# Patient Record
Sex: Male | Born: 1970 | Race: White | Hispanic: No | Marital: Married | Smoking: Former smoker
Health system: Southern US, Community
[De-identification: ages and names within clinical notes are randomized; demographics above are authoritative.]

## PROBLEM LIST (undated history)

## (undated) DIAGNOSIS — I10 Essential (primary) hypertension: Secondary | ICD-10-CM

## (undated) DIAGNOSIS — M1990 Unspecified osteoarthritis, unspecified site: Secondary | ICD-10-CM

---

## 2002-04-21 ENCOUNTER — Encounter: Payer: Self-pay | Admitting: Physical Medicine and Rehabilitation

## 2002-04-21 ENCOUNTER — Ambulatory Visit (HOSPITAL_COMMUNITY)
Admission: RE | Admit: 2002-04-21 | Discharge: 2002-04-21 | Payer: Self-pay | Admitting: Physical Medicine and Rehabilitation

## 2004-04-28 ENCOUNTER — Ambulatory Visit (HOSPITAL_COMMUNITY): Admission: RE | Admit: 2004-04-28 | Discharge: 2004-04-28 | Payer: Self-pay | Admitting: Specialist

## 2005-08-10 IMAGING — CR DG ORBITS FOR FOREIGN BODY
2 series · 2 of 2 positions shown · non-contrast
Comparison: none

CLINICAL DATA: Metal working/exposure;  clearance prior to MRI

ORBITS FOR FOREIGN BODY - 2 VIEW:

[view not recorded (1 of 2)]
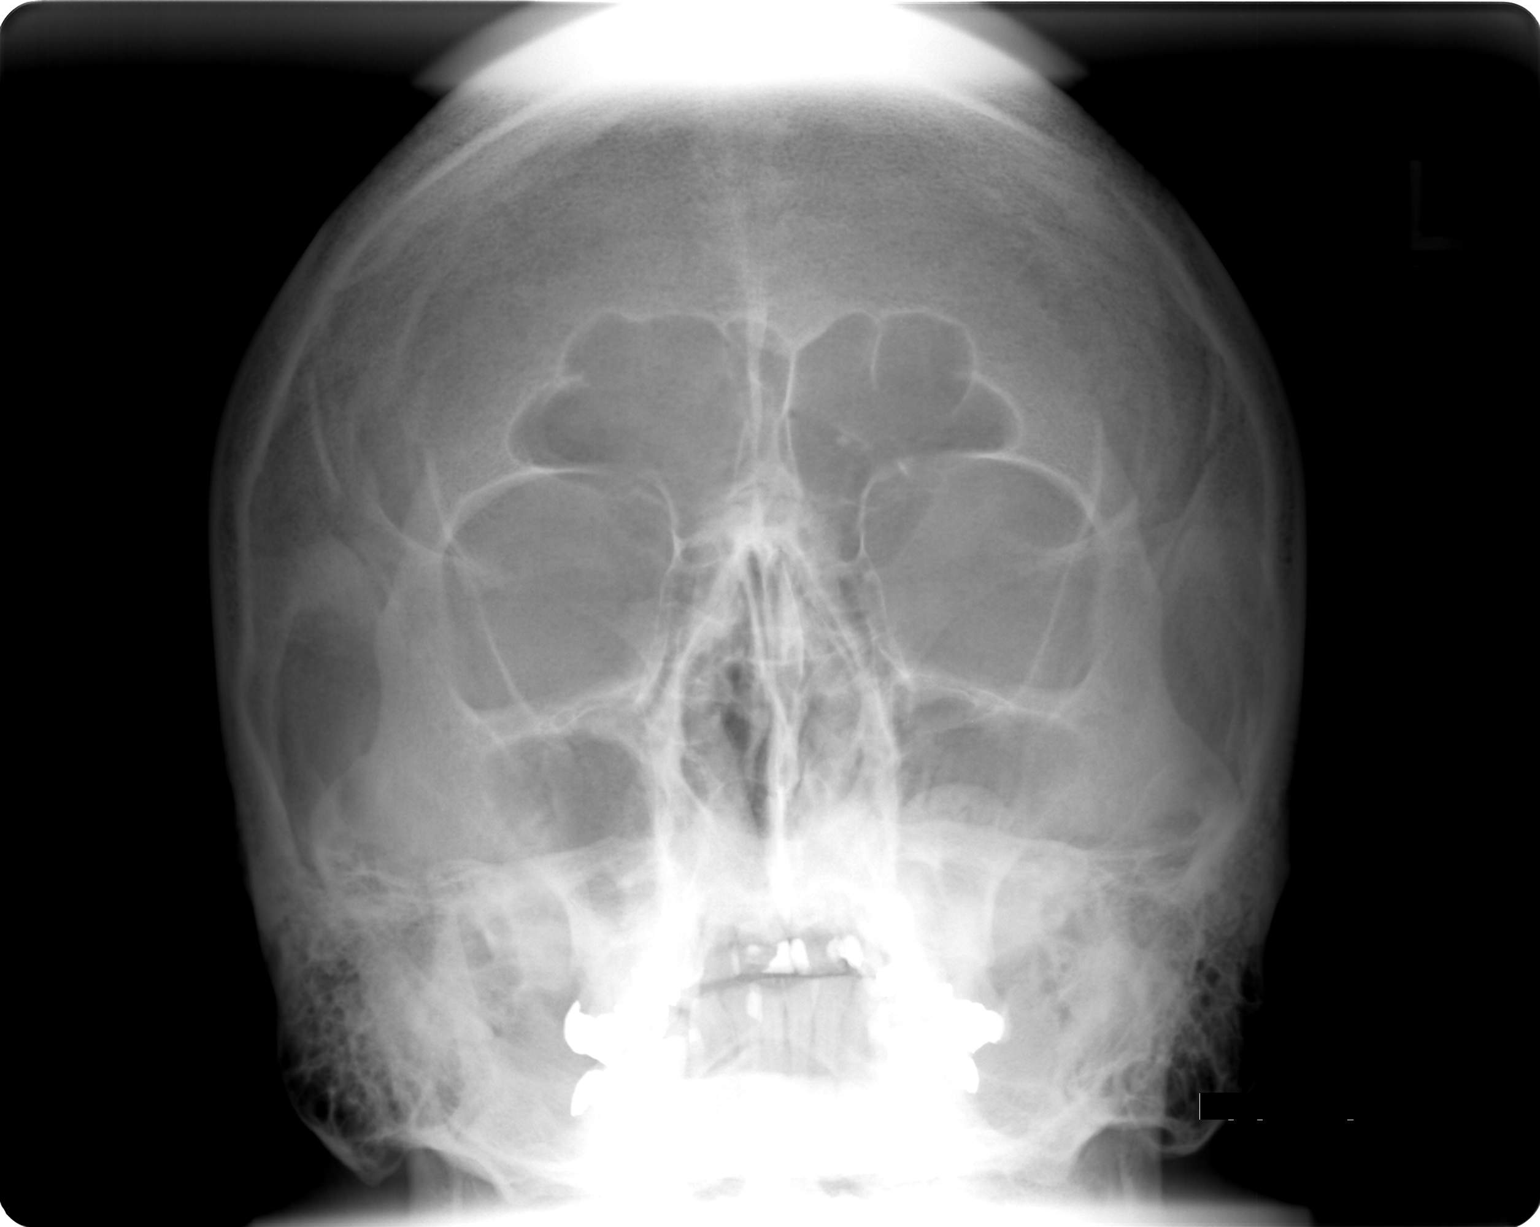

[view not recorded (2 of 2)]
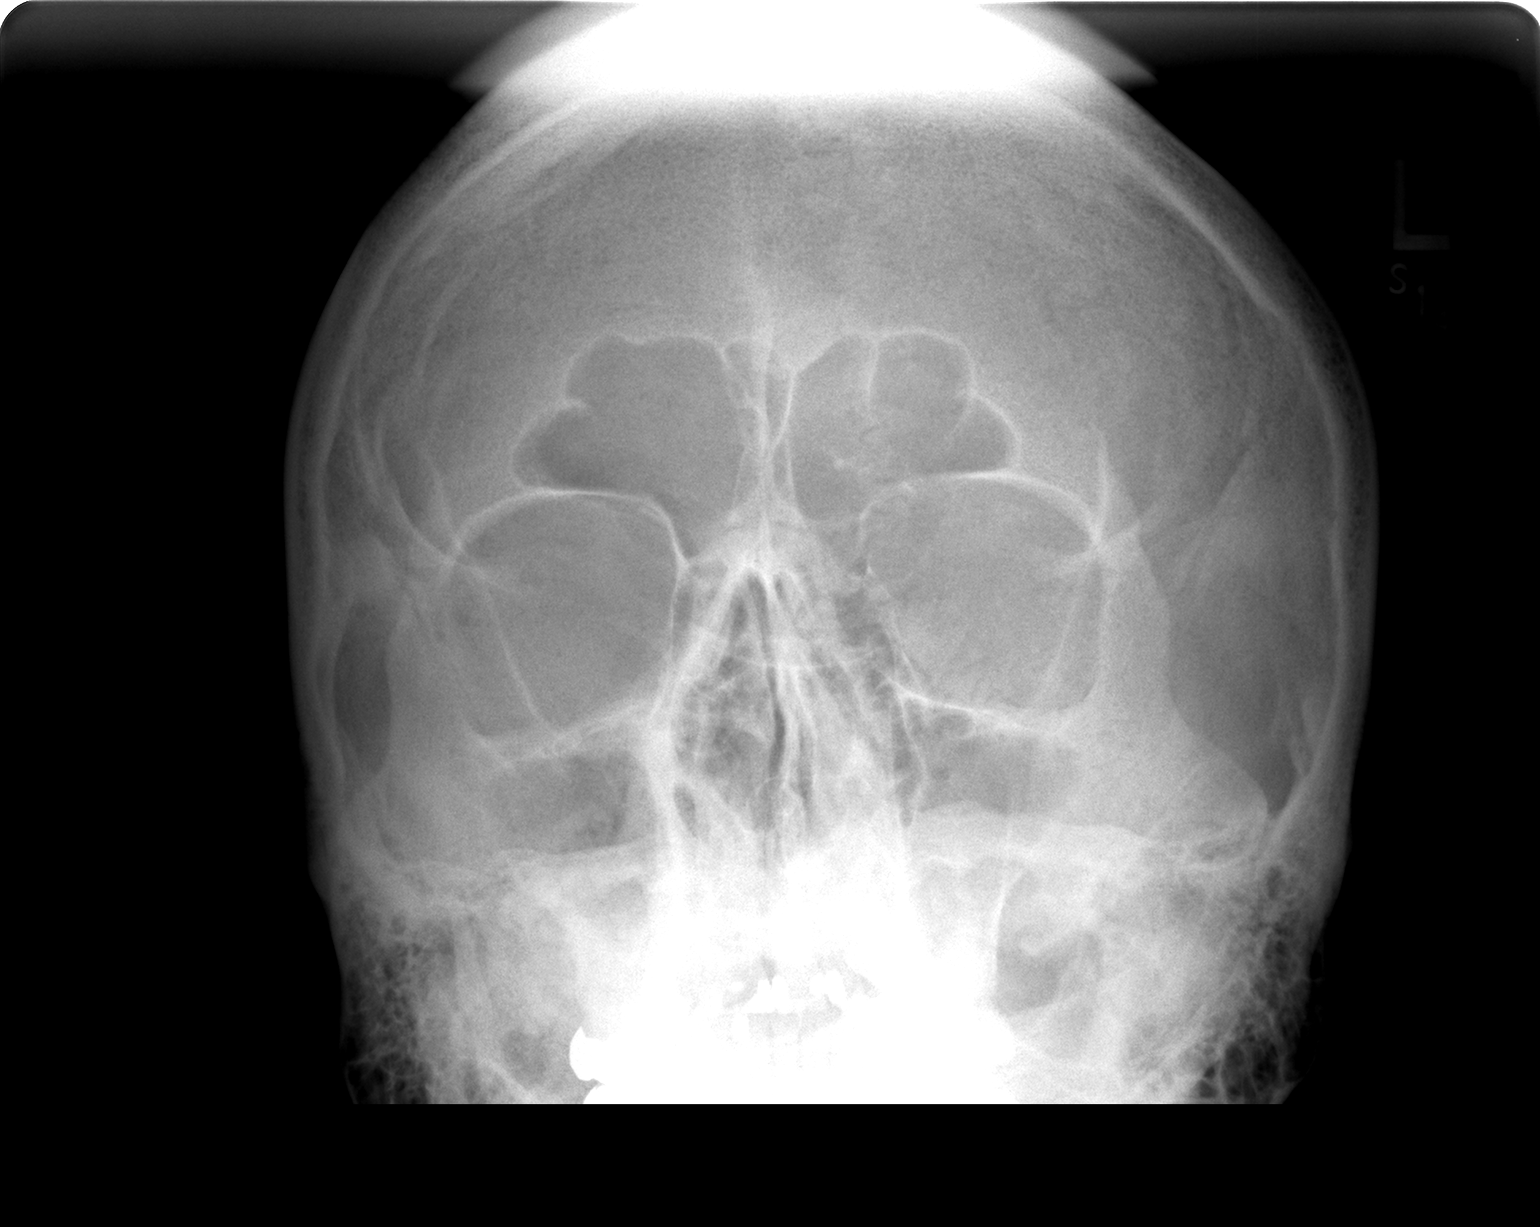

[2 of 2 positions shown; findings below may reference images not displayed]

FINDINGS: There is no evidence of metallic foreign body within the orbits.  No
significant bone abnormality identified.
IMPRESSION: No evidence of metallic foreign body within the orbits.

## 2020-05-10 NOTE — Patient Instructions (Addendum)
DUE TO COVID-19 ONLY ONE VISITOR IS ALLOWED TO COME WITH YOU AND STAY IN THE WAITING ROOM ONLY DURING PRE OP AND PROCEDURE DAY OF SURGERY. THE 1 VISITOR  MAY VISIT WITH YOU AFTER SURGERY IN YOUR PRIVATE ROOM DURING VISITING HOURS ONLY!  YOU NEED TO HAVE A COVID 19 TEST ON: 05/17/20 @ 8:30 AM, THIS TEST MUST BE DONE BEFORE SURGERY,  COVID TESTING SITE [address] [place] [address], IT IS ON THE RIGHT GOING OUT [address] APPROXIMATELY  2 MINUTES PAST [employer] ON THE RIGHT. ONCE YOUR COVID TEST IS COMPLETED,  PLEASE BEGIN THE QUARANTINE INSTRUCTIONS AS OUTLINED IN YOUR HANDOUT.                Brandon Gallegos   Your procedure is scheduled on: 05/21/20   Report to [facility] Main  Entrance   Report to short stay at: 5:30 AM     Call this number if you have problems the morning of surgery [phone]    Remember:  NO SOLID FOOD AFTER MIDNIGHT THE NIGHT PRIOR TO SURGERY. NOTHING BY MOUTH EXCEPT CLEAR LIQUIDS UNTIL: 4:15 AM . PLEASE FINISH ENSURE DRINK PER SURGEON ORDER  WHICH NEEDS TO BE COMPLETED AT: 4:15 AM .  CLEAR LIQUID DIET  Foods Allowed                                                                     Foods Excluded  Coffee and tea, regular and decaf                             liquids that you cannot  Plain Jell-O any favor except red or purple                                           see through such as: Fruit ices (not with fruit pulp)                                     milk, soups, orange juice  Iced Popsicles                                    All solid food Carbonated beverages, regular and diet                                    Cranberry, grape and apple juices Sports drinks like Gatorade Lightly seasoned clear broth or consume(fat free) Sugar, honey syrup  Sample Menu Breakfast                                Lunch  Supper Cranberry juice                    Beef broth                             Chicken broth Jell-O                                     Grape juice                           Apple juice Coffee or tea                        Jell-O                                      Popsicle                                                Coffee or tea                        Coffee or tea  _____________________________________________________________________   BRUSH YOUR TEETH MORNING OF SURGERY AND RINSE YOUR MOUTH OUT, NO CHEWING GUM CANDY OR MINTS.    Take these medicines the morning of surgery with A SIP OF WATER: otezla.                               You may not have any metal on your body including hair pins and              piercings  Do not wear jewelry, lotions, powders or perfumes, deodorant             Men may shave face and neck.   Do not bring valuables to the hospital. [facility] IS NOT             RESPONSIBLE   FOR VALUABLES.  Contacts, dentures or bridgework may not be worn into surgery.  Leave suitcase in the car. After surgery it may be brought to your room.     Patients discharged the day of surgery will not be allowed to drive home. IF YOU ARE HAVING SURGERY AND GOING HOME THE SAME DAY, YOU MUST HAVE AN ADULT TO DRIVE YOU HOME AND BE WITH YOU FOR 24 HOURS. YOU MAY GO HOME BY TAXI OR UBER OR ORTHERWISE, BUT AN ADULT MUST ACCOMPANY YOU HOME AND STAY WITH YOU FOR 24 HOURS.  Name and phone number of your driver:  Special Instructions: N/A              Please read over the following fact sheets you were given: _____________________________________________________________________         [facility] - Preparing for Surgery Before surgery, you can play an important role.  Because skin is not sterile, your skin needs to be as free of germs as possible.  You can reduce the number of germs on your skin by washing with CHG (chlorahexidine gluconate)  soap before surgery.  CHG is an antiseptic cleaner which kills germs and bonds with the skin to continue killing germs  even after washing. Please DO NOT use if you have an allergy to CHG or antibacterial soaps.  If your skin becomes reddened/irritated stop using the CHG and inform your nurse when you arrive at Short Stay. Do not shave (including legs and underarms) for at least 48 hours prior to the first CHG shower.  You may shave your face/neck. Please follow these instructions carefully:  1.  Shower with CHG Soap the night before surgery and the  morning of Surgery.  2.  If you choose to wash your hair, wash your hair first as usual with your  normal  shampoo.  3.  After you shampoo, rinse your hair and body thoroughly to remove the  shampoo.                           4.  Use CHG as you would any other liquid soap.  You can apply chg directly  to the skin and wash                       Gently with a scrungie or clean washcloth.  5.  Apply the CHG Soap to your body ONLY FROM THE NECK DOWN.   Do not use on face/ open                           Wound or open sores. Avoid contact with eyes, ears mouth and genitals (private parts).                       Wash face,  Genitals (private parts) with your normal soap.             6.  Wash thoroughly, paying special attention to the area where your surgery  will be performed.  7.  Thoroughly rinse your body with warm water from the neck down.  8.  DO NOT shower/wash with your normal soap after using and rinsing off  the CHG Soap.                9.  Pat yourself dry with a clean towel.            10.  Wear clean pajamas.            11.  Place clean sheets on your bed the night of your first shower and do not  sleep with pets. Day of Surgery : Do not apply any lotions/deodorants the morning of surgery.  Please wear clean clothes to the hospital/surgery center.  FAILURE TO FOLLOW THESE INSTRUCTIONS MAY RESULT IN THE CANCELLATION OF YOUR SURGERY PATIENT SIGNATURE_________________________________  NURSE  SIGNATURE__________________________________  ________________________________________________________________________   Brandon Gallegos  An incentive spirometer is a tool that can help keep your lungs clear and active. This tool measures how well you are filling your lungs with each breath. Taking long deep breaths may help reverse or decrease the chance of developing breathing (pulmonary) problems (especially infection) following:  A long period of time when you are unable to move or be active. BEFORE THE PROCEDURE   If the spirometer includes an indicator to show your best effort, your nurse or respiratory therapist will set it to a desired goal.  If possible, sit up straight or lean slightly forward.  Try not to slouch.  Hold the incentive spirometer in an upright position. INSTRUCTIONS FOR USE  1. Sit on the edge of your bed if possible, or sit up as far as you can in bed or on a chair. 2. Hold the incentive spirometer in an upright position. 3. Breathe out normally. 4. Place the mouthpiece in your mouth and seal your lips tightly around it. 5. Breathe in slowly and as deeply as possible, raising the piston or the ball toward the top of the column. 6. Hold your breath for 3-5 seconds or for as long as possible. Allow the piston or ball to fall to the bottom of the column. 7. Remove the mouthpiece from your mouth and breathe out normally. 8. Rest for a few seconds and repeat Steps 1 through 7 at least 10 times every 1-2 hours when you are awake. Take your time and take a few normal breaths between deep breaths. 9. The spirometer may include an indicator to show your best effort. Use the indicator as a goal to work toward during each repetition. 10. After each set of 10 deep breaths, practice coughing to be sure your lungs are clear. If you have an incision (the cut made at the time of surgery), support your incision when coughing by placing a pillow or rolled up towels firmly  against it. Once you are able to get out of bed, walk around indoors and cough well. You may stop using the incentive spirometer when instructed by your caregiver.  RISKS AND COMPLICATIONS  Take your time so you do not get dizzy or light-headed.  If you are in pain, you may need to take or ask for pain medication before doing incentive spirometry. It is harder to take a deep breath if you are having pain. AFTER USE  Rest and breathe slowly and easily.  It can be helpful to keep track of a log of your progress. Your caregiver can provide you with a simple table to help with this. If you are using the spirometer at home, follow these instructions: [place] IF:   You are having difficultly using the spirometer.  You have trouble using the spirometer as often as instructed.  Your pain medication is not giving enough relief while using the spirometer.  You develop fever of 100.5 F (38.1 C) or higher. SEEK IMMEDIATE MEDICAL CARE IF:   You cough up bloody sputum that had not been present before.  You develop fever of 102 F (38.9 C) or greater.  You develop worsening pain at or near the incision site. MAKE SURE YOU:   Understand these instructions.  Will watch your condition.  Will get help right away if you are not doing well or get worse. Document Released: 06/29/2006 Document Revised: 05/11/2011 Document Reviewed: 08/30/2006 [facility] Patient Information 2014 [place], [place].   ________________________________________________________________________

## 2020-05-13 ENCOUNTER — Encounter (HOSPITAL_COMMUNITY)
Admission: RE | Admit: 2020-05-13 | Discharge: 2020-05-13 | Disposition: A | Discharge status: Home / Self Care | Payer: BC Managed Care – PPO | Source: Ambulatory Visit | Attending: Orthopedic Surgery | Admitting: Orthopedic Surgery

## 2020-05-13 ENCOUNTER — Encounter (HOSPITAL_COMMUNITY): Payer: Self-pay

## 2020-05-13 ENCOUNTER — Other Ambulatory Visit: Payer: Self-pay

## 2020-05-13 DIAGNOSIS — Z01818 Encounter for other preprocedural examination: Secondary | ICD-10-CM | POA: Insufficient documentation

## 2020-05-13 HISTORY — DX: Unspecified osteoarthritis, unspecified site: M19.90

## 2020-05-13 HISTORY — DX: Essential (primary) hypertension: I10

## 2020-05-13 LAB — CBC
HCT: 48.7 % (ref 39.0–52.0)
Hemoglobin: 15.8 g/dL (ref 13.0–17.0)
MCH: 30 pg (ref 26.0–34.0)
MCHC: 32.4 g/dL (ref 30.0–36.0)
MCV: 92.6 fL (ref 80.0–100.0)
Platelets: 205 10*3/uL (ref 150–400)
RBC: 5.26 MIL/uL (ref 4.22–5.81)
RDW: 13.2 % (ref 11.5–15.5)
WBC: 7.2 10*3/uL (ref 4.0–10.5)
nRBC: 0 % (ref 0.0–0.2)

## 2020-05-13 LAB — BASIC METABOLIC PANEL
Anion gap: 9 (ref 5–15)
BUN: 14 mg/dL (ref 6–20)
CO2: 27 mmol/L (ref 22–32)
Calcium: 10.2 mg/dL (ref 8.9–10.3)
Chloride: 105 mmol/L (ref 98–111)
Creatinine, Ser: 1.14 mg/dL (ref 0.61–1.24)
GFR, Estimated: >60 mL/min (ref >60)
Glucose, Bld: 104 mg/dL — ABNORMAL HIGH (ref 70–99)
Potassium: 4.9 mmol/L (ref 3.5–5.1)
Sodium: 141 mmol/L (ref 135–145)

## 2020-05-13 LAB — SURGICAL PCR SCREEN
MRSA, PCR: NEGATIVE
Staphylococcus aureus: NEGATIVE

## 2020-05-13 NOTE — Progress Notes (Addendum)
COVID Vaccine Completed: Yes Date COVID Vaccine completed: 03/2019. [place] COVID vaccine manufacturer: Moderna     PCP - Dr. Earlie Server Cardiologist -   Chest x-ray -  EKG - requested from Dr. Corene Cornea office Stress Test -  ECHO -  Cardiac Cath -  Pacemaker/ICD device last checked:  Sleep Study - Yes CPAP - No  Fasting Blood Sugar -  Checks Blood Sugar _____ times a day  Blood Thinner Instructions:  Aspirin Instructions: To hold for 7 days before surgery, as per Dr. Charlann Boxer. Last Dose:  Anesthesia review: Hx: HTN,OSA(No CPAP)  Patient denies shortness of breath, fever, cough and chest pain at PAT appointment   Patient verbalized understanding of instructions that were given to them at the PAT appointment. Patient was also instructed that they will need to review over the PAT instructions again at home before surgery.

## 2020-05-17 ENCOUNTER — Other Ambulatory Visit (HOSPITAL_COMMUNITY)
Admission: RE | Admit: 2020-05-17 | Discharge: 2020-05-17 | Disposition: A | Discharge status: Home / Self Care | Payer: BC Managed Care – PPO | Source: Ambulatory Visit | Attending: Orthopedic Surgery | Admitting: Orthopedic Surgery

## 2020-05-17 DIAGNOSIS — Z20822 Contact with and (suspected) exposure to covid-19: Secondary | ICD-10-CM | POA: Diagnosis not present

## 2020-05-17 DIAGNOSIS — Z01812 Encounter for preprocedural laboratory examination: Secondary | ICD-10-CM | POA: Insufficient documentation

## 2020-05-17 LAB — SARS CORONAVIRUS 2 (TAT 6-24 HRS): SARS Coronavirus 2: NEGATIVE

## 2020-05-19 NOTE — H&P (Signed)
TOTAL KNEE ADMISSION H&P  Patient is being admitted for right total knee arthroplasty.  Subjective:  Chief Complaint:    Right knee OA / pain  HPI: Brandon Gallegos, 50 y.o. male, has a history of pain and functional disability in the right knee due to arthritis and has failed non-surgical conservative treatments for greater than 12 weeks to include NSAID's and/or analgesics, corticosteriod injections and activity modification.  Onset of symptoms was gradual, starting 10 years ago with gradually worsening course since that time. The patient noted prior procedures on the knee to include  ACL reconstruction and MCL reconstruction on the right knee(s).  Patient currently rates pain in the right knee(s) at 10 out of 10 with activity. Patient has night pain, worsening of pain with activity and weight bearing, pain that interferes with activities of daily living, pain with passive range of motion, crepitus and joint swelling.  Patient has evidence of periarticular osteophytes and joint space narrowing by imaging studies. There is no active infection.  Risks, benefits and expectations were discussed with the patient.  Risks including but not limited to the risk of anesthesia, blood clots, nerve damage, blood vessel damage, failure of the prosthesis, infection and up to and including death.  Patient understand the risks, benefits and expectations and wishes to proceed with surgery.   D/C Plans:      Home   Post-op Meds:       No Rx given   Tranexamic Acid:      To be given - IV   Decadron:      Is to be given  FYI:      ASA  Percocet 5/325 (used after previous surgery)  DME:   Rx sent for - RW & 3-N-1  PT:   OPPT    Pharmacy: [facility] - [place]    Past Medical History:  Diagnosis Date  . Arthritis   . Hypertension     Past Surgical History:  Procedure Laterality Date  . BACK SURGERY     x 2  . HERNIA REPAIR    . KNEE ARTHROSCOPY Right     No current facility-administered  medications for this encounter.   Current Outpatient Medications  Medication Sig Dispense Refill Last Dose  . Aspirin-Acetaminophen-Caffeine (GOODY HEADACHE PO) Take 1 packet by mouth daily as needed (pain).     Marland Kitchen lisinopril (ZESTRIL) 20 MG tablet Take 20 mg by mouth daily.     Marland Kitchen OTEZLA 30 MG TABS Take 30 mg by mouth 2 (two) times daily.     Marland Kitchen triamcinolone (KENALOG) 0.1 % Apply 1 application topically 2 (two) times daily as needed (psoriasis). TAC 0.1%-SSD1% 3:1      No Known Allergies   Social History   Tobacco Use  . Smoking status: Former Smoker    Packs/day: 2.00    Years: 10.00    Pack years: 20.00    Types: Cigarettes  . Smokeless tobacco: Never Used  Substance Use Topics  . Alcohol use: Not Currently       Review of Systems  Constitutional: Negative.   HENT: Negative.   Eyes: Negative.   Respiratory: Negative.   Cardiovascular: Negative.   Gastrointestinal: Negative.   Genitourinary: Negative.   Musculoskeletal: Positive for joint pain.  Skin: Negative.   Neurological: Negative.   Endo/Heme/Allergies: Negative.   Psychiatric/Behavioral: Negative.      Objective:  Physical Exam Constitutional:      Appearance: He is well-developed.  HENT:  Head: Normocephalic.  Eyes:     Pupils: Pupils are equal, round, and reactive to light.  Neck:     Thyroid: No thyromegaly.     Vascular: No JVD.     Trachea: No tracheal deviation.  Cardiovascular:     Rate and Rhythm: Normal rate and regular rhythm.  Pulmonary:     Effort: Pulmonary effort is normal. No respiratory distress.     Breath sounds: Normal breath sounds. No wheezing.  Abdominal:     Palpations: Abdomen is soft.     Tenderness: There is no abdominal tenderness. There is no guarding.  Musculoskeletal:     Cervical back: Neck supple.     Right knee: Swelling and bony tenderness present. No erythema or ecchymosis. Decreased range of motion. Tenderness present.  Lymphadenopathy:     Cervical: No  cervical adenopathy.  Skin:    General: Skin is warm and dry.  Neurological:     Mental Status: He is alert and oriented to person, place, and time.      Labs:  Estimated body mass index is 33.6 kg/m as calculated from the following:   Height as of 05/13/20: 5\' 8"  (1.727 m).   Weight as of 05/13/20: 100.2 kg.   Imaging Review Plain radiographs demonstrate severe degenerative joint disease of the right knee(s). The bone quality appears to be good for age and reported activity level.      Assessment/Plan:  End stage arthritis, right knee   The patient history, physical examination, clinical judgment of the provider and imaging studies are consistent with end stage degenerative joint disease of the right knee(s) and total knee arthroplasty is deemed medically necessary. The treatment options including medical management, injection therapy arthroscopy and arthroplasty were discussed at length. The risks and benefits of total knee arthroplasty were presented and reviewed. The risks due to aseptic loosening, infection, stiffness, patella tracking problems, thromboembolic complications and other imponderables were discussed. The patient acknowledged the explanation, agreed to proceed with the plan and consent was signed. Patient is being admitted for treatment for surgery, pain control, PT, OT, prophylactic antibiotics, VTE prophylaxis, progressive ambulation and ADL's and discharge planning. The patient is planning to be discharged home.     Patient's anticipated LOS is less than 2 midnights, meeting these requirements: - Younger than 88 - Lives within 1 hour of care - Has a competent adult at home to recover with post-op recover - NO history of  - Chronic pain requiring opiods  - Diabetes  - Coronary Artery Disease  - Heart failure  - Heart attack  - Stroke  - DVT/VTE  - Cardiac arrhythmia  - Respiratory Failure/COPD  - Renal failure  - Anemia  - Advanced Liver  disease

## 2020-05-20 ENCOUNTER — Encounter (HOSPITAL_COMMUNITY): Payer: Self-pay | Admitting: Orthopedic Surgery

## 2020-05-20 NOTE — Anesthesia Preprocedure Evaluation (Addendum)
Anesthesia Evaluation  Patient identified by MRN, date of birth, ID band Patient awake    Reviewed: Allergy & Precautions, NPO status , Patient's Chart, lab work & pertinent test results, reviewed documented beta blocker date and time   Airway Mallampati: I  TM Distance: >3 FB Neck ROM: Full    Dental  (+) Missing, Caps, Dental Advisory Given   Pulmonary sleep apnea , former smoker,    Pulmonary exam normal breath sounds clear to auscultation       Cardiovascular hypertension, Pt. on medications Normal cardiovascular exam Rhythm:Regular Rate:Normal     Neuro/Psych negative neurological ROS  negative psych ROS   GI/Hepatic negative GI ROS, Neg liver ROS,   Endo/Other  Obesity  Renal/GU negative Renal ROS  negative genitourinary   Musculoskeletal  (+) Arthritis , Osteoarthritis,  OA right knee Psoriasis Hx/o previous back surgery   Abdominal (+) + obese,   Peds  Hematology negative hematology ROS (+)   Anesthesia Other Findings   Reproductive/Obstetrics                           Anesthesia Physical Anesthesia Plan  ASA: II  Anesthesia Plan: Spinal   Post-op Pain Management:  Regional for Post-op pain   Induction: Intravenous  PONV Risk Score and Plan: 3 and Treatment may vary due to age or medical condition, Midazolam, Propofol infusion and Ondansetron  Airway Management Planned: Natural Airway and Simple Face Mask  Additional Equipment:   Intra-op Plan:   Post-operative Plan: Extubation in OR  Informed Consent: I have reviewed the patients History and Physical, chart, labs and discussed the procedure including the risks, benefits and alternatives for the proposed anesthesia with the patient or authorized representative who has indicated his/her understanding and acceptance.     Dental advisory given  Plan Discussed with: Anesthesiologist and CRNA  Anesthesia Plan  Comments:        Anesthesia Quick Evaluation

## 2020-05-21 ENCOUNTER — Observation Stay (HOSPITAL_COMMUNITY)
Admission: RE | Admit: 2020-05-21 | Discharge: 2020-05-22 | Disposition: A | Discharge status: Home / Self Care | Payer: BC Managed Care – PPO | Attending: Orthopedic Surgery | Admitting: Orthopedic Surgery

## 2020-05-21 ENCOUNTER — Ambulatory Visit (HOSPITAL_COMMUNITY): Payer: BC Managed Care – PPO | Admitting: Certified Registered"

## 2020-05-21 ENCOUNTER — Other Ambulatory Visit: Payer: Self-pay

## 2020-05-21 ENCOUNTER — Encounter (HOSPITAL_COMMUNITY): Payer: Self-pay | Admitting: Orthopedic Surgery

## 2020-05-21 ENCOUNTER — Encounter (HOSPITAL_COMMUNITY)
Admission: RE | Disposition: A | Discharge status: Home / Self Care | Payer: Self-pay | Source: Home / Self Care | Attending: Orthopedic Surgery

## 2020-05-21 DIAGNOSIS — Z87891 Personal history of nicotine dependence: Secondary | ICD-10-CM | POA: Diagnosis not present

## 2020-05-21 DIAGNOSIS — M1711 Unilateral primary osteoarthritis, right knee: Secondary | ICD-10-CM | POA: Diagnosis not present

## 2020-05-21 DIAGNOSIS — Z79899 Other long term (current) drug therapy: Secondary | ICD-10-CM | POA: Insufficient documentation

## 2020-05-21 DIAGNOSIS — Z96651 Presence of right artificial knee joint: Secondary | ICD-10-CM

## 2020-05-21 DIAGNOSIS — I10 Essential (primary) hypertension: Secondary | ICD-10-CM | POA: Diagnosis not present

## 2020-05-21 HISTORY — PX: TOTAL KNEE ARTHROPLASTY: SHX125

## 2020-05-21 LAB — TYPE AND SCREEN
ABO/RH(D): A POS
Antibody Screen: NEGATIVE

## 2020-05-21 LAB — ABO/RH: ABO/RH(D): A POS

## 2020-05-21 SURGERY — ARTHROPLASTY, KNEE, TOTAL
Anesthesia: Spinal | Site: Knee | Laterality: Right

## 2020-05-21 MED ORDER — FENTANYL CITRATE (PF) 100 MCG/2ML IJ SOLN
25.0000 ug | INTRAMUSCULAR | Status: DC | PRN
Start: 2020-05-21 — End: 2020-05-21

## 2020-05-21 MED ORDER — SODIUM CHLORIDE 0.9 % IR SOLN
Status: DC | PRN
  Administered 2020-05-21: 3000 mL

## 2020-05-21 MED ORDER — METOCLOPRAMIDE HCL 5 MG/ML IJ SOLN
5.0000 mg | Freq: Three times a day (TID) | INTRAMUSCULAR | Status: DC | PRN
Start: 2020-05-21 — End: 2020-05-22

## 2020-05-21 MED ORDER — ONDANSETRON HCL 4 MG/2ML IJ SOLN: 4.0000 mg | Freq: Four times a day (QID) | INTRAMUSCULAR | Status: DC | PRN

## 2020-05-21 MED ORDER — FENTANYL CITRATE (PF) 100 MCG/2ML IJ SOLN
INTRAMUSCULAR | Status: DC | PRN
  Administered 2020-05-21 (×2): 50 ug via INTRAVENOUS

## 2020-05-21 MED ORDER — MENTHOL 3 MG MT LOZG: 1.0000 | LOZENGE | OROMUCOSAL | Status: DC | PRN

## 2020-05-21 MED ORDER — OXYCODONE HCL 5 MG/5ML PO SOLN: 5.0000 mg | Freq: Once | ORAL | Status: DC | PRN

## 2020-05-21 MED ORDER — METOCLOPRAMIDE HCL 5 MG PO TABS: 5.0000 mg | ORAL_TABLET | Freq: Three times a day (TID) | ORAL | Status: DC | PRN

## 2020-05-21 MED ORDER — TRIAMCINOLONE ACETONIDE 0.1 % EX CREA: 1.0000 "application " | TOPICAL_CREAM | Freq: Two times a day (BID) | CUTANEOUS | Status: DC | PRN

## 2020-05-21 MED ORDER — GLYCOPYRROLATE 0.2 MG/ML IJ SOLN
INTRAMUSCULAR | Status: DC | PRN
  Administered 2020-05-21: .2 mg via INTRAVENOUS

## 2020-05-21 MED ORDER — TRANEXAMIC ACID-NACL 1000-0.7 MG/100ML-% IV SOLN
1000.0000 mg | INTRAVENOUS | Status: AC
  Administered 2020-05-21: 1000 mg via INTRAVENOUS
  Filled 2020-05-21: qty 100

## 2020-05-21 MED ORDER — ONDANSETRON HCL 4 MG/2ML IJ SOLN: 4.0000 mg | Freq: Once | INTRAMUSCULAR | Status: DC | PRN

## 2020-05-21 MED ORDER — DOCUSATE SODIUM 100 MG PO CAPS
100.0000 mg | ORAL_CAPSULE | Freq: Two times a day (BID) | ORAL | Status: DC
  Administered 2020-05-22: 100 mg via ORAL
  Filled 2020-05-21 (×2): qty 1

## 2020-05-21 MED ORDER — METHOCARBAMOL 500 MG PO TABS
500.0000 mg | ORAL_TABLET | Freq: Four times a day (QID) | ORAL | Status: DC | PRN
  Administered 2020-05-21 – 2020-05-22 (×2): 500 mg via ORAL
  Filled 2020-05-21 (×3): qty 1

## 2020-05-21 MED ORDER — LACTATED RINGERS IV SOLN: INTRAVENOUS | Status: DC

## 2020-05-21 MED ORDER — BISACODYL 10 MG RE SUPP: 10.0000 mg | Freq: Every day | RECTAL | Status: DC | PRN

## 2020-05-21 MED ORDER — MIDAZOLAM HCL 2 MG/2ML IJ SOLN
INTRAMUSCULAR | Status: AC
  Filled 2020-05-21: qty 2

## 2020-05-21 MED ORDER — KETOROLAC TROMETHAMINE 30 MG/ML IJ SOLN
INTRAMUSCULAR | Status: AC
  Filled 2020-05-21: qty 1

## 2020-05-21 MED ORDER — TRANEXAMIC ACID-NACL 1000-0.7 MG/100ML-% IV SOLN
1000.0000 mg | Freq: Once | INTRAVENOUS | Status: AC
  Administered 2020-05-21: 1000 mg via INTRAVENOUS
  Filled 2020-05-21: qty 100

## 2020-05-21 MED ORDER — HYDROMORPHONE HCL 1 MG/ML IJ SOLN
0.5000 mg | INTRAMUSCULAR | Status: DC | PRN
  Administered 2020-05-21: 1 mg via INTRAVENOUS
  Filled 2020-05-21: qty 1

## 2020-05-21 MED ORDER — PROPOFOL 10 MG/ML IV BOLUS
INTRAVENOUS | Status: DC | PRN
  Administered 2020-05-21: 30 mg via INTRAVENOUS
  Administered 2020-05-21: 20 mg via INTRAVENOUS

## 2020-05-21 MED ORDER — FERROUS SULFATE 325 (65 FE) MG PO TABS
325.0000 mg | ORAL_TABLET | Freq: Three times a day (TID) | ORAL | Status: DC
  Administered 2020-05-22 (×2): 325 mg via ORAL
  Filled 2020-05-21 (×2): qty 1

## 2020-05-21 MED ORDER — DEXAMETHASONE SODIUM PHOSPHATE 10 MG/ML IJ SOLN
10.0000 mg | Freq: Once | INTRAMUSCULAR | Status: AC
  Administered 2020-05-22: 10 mg via INTRAVENOUS
  Filled 2020-05-21: qty 1

## 2020-05-21 MED ORDER — DEXAMETHASONE SODIUM PHOSPHATE 10 MG/ML IJ SOLN
10.0000 mg | Freq: Once | INTRAMUSCULAR | Status: AC
  Administered 2020-05-21: 10 mg via INTRAVENOUS

## 2020-05-21 MED ORDER — PHENOL 1.4 % MT LIQD: 1.0000 | OROMUCOSAL | Status: DC | PRN

## 2020-05-21 MED ORDER — CEFAZOLIN SODIUM-DEXTROSE 2-4 GM/100ML-% IV SOLN
2.0000 g | Freq: Four times a day (QID) | INTRAVENOUS | Status: AC
  Administered 2020-05-21: 2 g via INTRAVENOUS
  Filled 2020-05-21 (×3): qty 100

## 2020-05-21 MED ORDER — PHENYLEPHRINE HCL-NACL 10-0.9 MG/250ML-% IV SOLN
INTRAVENOUS | Status: AC
  Filled 2020-05-21: qty 500

## 2020-05-21 MED ORDER — ORAL CARE MOUTH RINSE: 15.0000 mL | Freq: Once | OROMUCOSAL | Status: AC

## 2020-05-21 MED ORDER — FENTANYL CITRATE (PF) 100 MCG/2ML IJ SOLN
INTRAMUSCULAR | Status: AC
  Filled 2020-05-21: qty 2

## 2020-05-21 MED ORDER — PHENYLEPHRINE HCL-NACL 10-0.9 MG/250ML-% IV SOLN
INTRAVENOUS | Status: DC | PRN
  Administered 2020-05-21: 50 ug/min via INTRAVENOUS

## 2020-05-21 MED ORDER — BUPIVACAINE IN DEXTROSE 0.75-8.25 % IT SOLN
INTRATHECAL | Status: DC | PRN
  Administered 2020-05-21: 1.7 mL via INTRATHECAL

## 2020-05-21 MED ORDER — POLYETHYLENE GLYCOL 3350 17 G PO PACK: 17.0000 g | PACK | Freq: Every day | ORAL | Status: DC | PRN

## 2020-05-21 MED ORDER — ASPIRIN 81 MG PO CHEW
81.0000 mg | CHEWABLE_TABLET | Freq: Two times a day (BID) | ORAL | Status: DC
  Administered 2020-05-22: 81 mg via ORAL
  Filled 2020-05-21 (×2): qty 1

## 2020-05-21 MED ORDER — MIDAZOLAM HCL 2 MG/2ML IJ SOLN
INTRAMUSCULAR | Status: DC | PRN
  Administered 2020-05-21: 2 mg via INTRAVENOUS

## 2020-05-21 MED ORDER — OXYCODONE HCL 5 MG PO TABS: 5.0000 mg | ORAL_TABLET | Freq: Once | ORAL | Status: DC | PRN

## 2020-05-21 MED ORDER — SODIUM CHLORIDE (PF) 0.9 % IJ SOLN
INTRAMUSCULAR | Status: AC
  Filled 2020-05-21: qty 30

## 2020-05-21 MED ORDER — ONDANSETRON HCL 4 MG/2ML IJ SOLN
INTRAMUSCULAR | Status: DC | PRN
  Administered 2020-05-21: 4 mg via INTRAVENOUS

## 2020-05-21 MED ORDER — METHOCARBAMOL 1000 MG/10ML IJ SOLN
500.0000 mg | Freq: Four times a day (QID) | INTRAVENOUS | Status: DC | PRN
  Filled 2020-05-21: qty 5

## 2020-05-21 MED ORDER — ACETAMINOPHEN 325 MG PO TABS
325.0000 mg | ORAL_TABLET | Freq: Four times a day (QID) | ORAL | Status: DC | PRN
  Administered 2020-05-21: 650 mg via ORAL
  Filled 2020-05-21: qty 2

## 2020-05-21 MED ORDER — BUPIVACAINE-EPINEPHRINE (PF) 0.25% -1:200000 IJ SOLN
INTRAMUSCULAR | Status: DC | PRN
  Administered 2020-05-21: 30 mL via PERINEURAL

## 2020-05-21 MED ORDER — KETOROLAC TROMETHAMINE 30 MG/ML IJ SOLN
INTRAMUSCULAR | Status: DC | PRN
  Administered 2020-05-21: 30 mg via INTRAMUSCULAR

## 2020-05-21 MED ORDER — OXYCODONE HCL 5 MG PO TABS
10.0000 mg | ORAL_TABLET | ORAL | Status: DC | PRN
  Administered 2020-05-22 (×2): 15 mg via ORAL
  Filled 2020-05-21 (×3): qty 3

## 2020-05-21 MED ORDER — OXYCODONE HCL 5 MG PO TABS
5.0000 mg | ORAL_TABLET | ORAL | Status: DC | PRN
  Administered 2020-05-21 (×3): 5 mg via ORAL
  Filled 2020-05-21 (×3): qty 1

## 2020-05-21 MED ORDER — CHLORHEXIDINE GLUCONATE 0.12 % MT SOLN
15.0000 mL | Freq: Once | OROMUCOSAL | Status: AC
  Administered 2020-05-21: 15 mL via OROMUCOSAL

## 2020-05-21 MED ORDER — PROPOFOL 500 MG/50ML IV EMUL
INTRAVENOUS | Status: DC | PRN
  Administered 2020-05-21: 40 ug/kg/min via INTRAVENOUS

## 2020-05-21 MED ORDER — POVIDONE-IODINE 10 % EX SWAB
2.0000 | Freq: Once | CUTANEOUS | Status: AC
Start: 2020-05-21 — End: 2020-05-21
  Administered 2020-05-21: 2 via TOPICAL

## 2020-05-21 MED ORDER — CELECOXIB 200 MG PO CAPS
200.0000 mg | ORAL_CAPSULE | Freq: Two times a day (BID) | ORAL | Status: DC
  Administered 2020-05-22: 200 mg via ORAL
  Filled 2020-05-21 (×2): qty 1

## 2020-05-21 MED ORDER — CEFAZOLIN SODIUM-DEXTROSE 2-4 GM/100ML-% IV SOLN
2.0000 g | INTRAVENOUS | Status: AC
  Administered 2020-05-21: 2 g via INTRAVENOUS
  Filled 2020-05-21: qty 100

## 2020-05-21 MED ORDER — ONDANSETRON HCL 4 MG PO TABS: 4.0000 mg | ORAL_TABLET | Freq: Four times a day (QID) | ORAL | Status: DC | PRN

## 2020-05-21 MED ORDER — LISINOPRIL 20 MG PO TABS
20.0000 mg | ORAL_TABLET | Freq: Every day | ORAL | Status: DC
  Administered 2020-05-22: 20 mg via ORAL
  Filled 2020-05-21: qty 1

## 2020-05-21 MED ORDER — ROPIVACAINE HCL 7.5 MG/ML IJ SOLN
INTRAMUSCULAR | Status: DC | PRN
  Administered 2020-05-21: 20 mL via PERINEURAL

## 2020-05-21 MED ORDER — BUPIVACAINE-EPINEPHRINE (PF) 0.25% -1:200000 IJ SOLN
INTRAMUSCULAR | Status: AC
  Filled 2020-05-21: qty 30

## 2020-05-21 SURGICAL SUPPLY — 49 items
ATTUNE MED ANAT PAT 38 KNEE (Knees) ×2 IMPLANT
ATTUNE PS FEM RT SZ 6 CEM KNEE (Femur) ×2 IMPLANT
ATTUNE PSRP INSR SZ6 8 KNEE (Insert) ×2 IMPLANT
BASE TIBIA ATTUNE KNEE SYS SZ6 (Knees) ×1 IMPLANT
BLADE SAW SGTL 13.0X1.19X90.0M (BLADE) ×2 IMPLANT
BLADE SURG SZ10 CARB STEEL (BLADE) ×4 IMPLANT
BNDG ELASTIC 6X10 VLCR STRL LF (GAUZE/BANDAGES/DRESSINGS) ×2 IMPLANT
BNDG ELASTIC 6X5.8 VLCR STR LF (GAUZE/BANDAGES/DRESSINGS) ×2 IMPLANT
BOWL SMART MIX CTS (DISPOSABLE) ×2 IMPLANT
CEMENT HV SMART SET (Cement) ×4 IMPLANT
CUFF TOURN SGL QUICK 34 (TOURNIQUET CUFF) ×2
CUFF TRNQT CYL 34X4.125X (TOURNIQUET CUFF) ×1 IMPLANT
DERMABOND ADVANCED (GAUZE/BANDAGES/DRESSINGS) ×1
DERMABOND ADVANCED .7 DNX12 (GAUZE/BANDAGES/DRESSINGS) ×1 IMPLANT
DRAPE U-SHAPE 47X51 STRL (DRAPES) ×2 IMPLANT
DRESSING AQUACEL AG SP 3.5X10 (GAUZE/BANDAGES/DRESSINGS) ×1 IMPLANT
DRSG AQUACEL AG SP 3.5X10 (GAUZE/BANDAGES/DRESSINGS) ×2
DURAPREP 26ML APPLICATOR (WOUND CARE) ×4 IMPLANT
ELECT REM PT RETURN 15FT ADLT (MISCELLANEOUS) ×2 IMPLANT
GLOVE ORTHO TXT STRL SZ7.5 (GLOVE) ×2 IMPLANT
GLOVE SURG ENC MOIS LTX SZ6 (GLOVE) ×2 IMPLANT
GLOVE SURG LTX SZ8 (GLOVE) ×2 IMPLANT
GLOVE SURG UNDER POLY LF SZ6.5 (GLOVE) IMPLANT
GLOVE SURG UNDER POLY LF SZ7.5 (GLOVE) ×2 IMPLANT
GOWN STRL REUS W/TWL LRG LVL3 (GOWN DISPOSABLE) ×2 IMPLANT
HANDPIECE INTERPULSE COAX TIP (DISPOSABLE) ×2
KIT TURNOVER KIT A (KITS) ×2 IMPLANT
MANIFOLD NEPTUNE II (INSTRUMENTS) ×2 IMPLANT
NDL SAFETY ECLIPSE 18X1.5 (NEEDLE) IMPLANT
NEEDLE HYPO 18GX1.5 SHARP (NEEDLE)
NS IRRIG 1000ML POUR BTL (IV SOLUTION) ×2 IMPLANT
PACK TOTAL KNEE CUSTOM (KITS) ×2 IMPLANT
PENCIL SMOKE EVACUATOR (MISCELLANEOUS) ×2 IMPLANT
PIN DRILL FIX HALF THREAD (BIT) ×2 IMPLANT
PIN FIX SIGMA LCS THRD HI (PIN) ×2 IMPLANT
PROTECTOR NERVE ULNAR (MISCELLANEOUS) ×2 IMPLANT
SET HNDPC FAN SPRY TIP SCT (DISPOSABLE) ×1 IMPLANT
SET PAD KNEE POSITIONER (MISCELLANEOUS) ×2 IMPLANT
SUT MNCRL AB 4-0 PS2 18 (SUTURE) ×2 IMPLANT
SUT STRATAFIX PDS+ 0 24IN (SUTURE) ×2 IMPLANT
SUT VIC AB 1 CT1 36 (SUTURE) ×2 IMPLANT
SUT VIC AB 2-0 CT1 27 (SUTURE) ×6
SUT VIC AB 2-0 CT1 TAPERPNT 27 (SUTURE) ×3 IMPLANT
SYR 3ML LL SCALE MARK (SYRINGE) ×2 IMPLANT
TIBIA ATTUNE KNEE SYS BASE SZ6 (Knees) ×2 IMPLANT
TRAY FOLEY MTR SLVR 16FR STAT (SET/KITS/TRAYS/PACK) ×2 IMPLANT
TUBE SUCTION HIGH CAP CLEAR NV (SUCTIONS) ×2 IMPLANT
WATER STERILE IRR 1000ML POUR (IV SOLUTION) ×4 IMPLANT
WRAP KNEE MAXI GEL POST OP (GAUZE/BANDAGES/DRESSINGS) ×2 IMPLANT

## 2020-05-21 NOTE — Op Note (Signed)
NAME:  Brandon Gallegos                      MEDICAL RECORD NO.:  [id number]                             FACILITY:  [facility]      PHYSICIAN:  Madlyn Frankel. Charlann Boxer, M.D.  DATE OF BIRTH:  01-08-1971      DATE OF PROCEDURE:  05/21/2020                                     OPERATIVE REPORT         PREOPERATIVE DIAGNOSIS:  Right knee osteoarthritis.      POSTOPERATIVE DIAGNOSIS:  Right knee osteoarthritis.      FINDINGS:  The patient was noted to have complete loss of cartilage and   bone-on-bone arthritis with associated osteophytes in the medial and patellofemoral compartments of   the knee with a significant synovitis and associated effusion.  The patient had failed months of conservative treatment including medications, injection therapy, activity modification.     PROCEDURE:  Right total knee replacement with removal of proximal tibial hardware (staple)     COMPONENTS USED:  DePuy Attune rotating platform posterior stabilized knee   system, a size 6 femur, 6 tibia, size 8 mm PS AOX insert, and 38 anatomic patellar   button.      SURGEON:  Madlyn Frankel. Charlann Boxer, M.D.      ASSISTANT:  Dennie Bible, PA-C.      ANESTHESIA:  Regional and Spinal.      SPECIMENS:  None.      COMPLICATION:  None.      DRAINS:  None.  EBL: <100 cc      TOURNIQUET TIME:   Total Tourniquet Time Documented: Thigh (Right) - 33 minutes Total: Thigh (Right) - 33 minutes       The patient was stable to the recovery room.      INDICATION FOR PROCEDURE:  Brandon Gallegos is a 50 y.o. male patient of   mine.  The patient had been seen, evaluated, and treated for months conservatively in the   office with medication, activity modification, and injections.  The patient had   radiographic changes of bone-on-bone arthritis with endplate sclerosis and osteophytes noted.  Based on the radiographic changes and failed conservative measures, the patient   decided to proceed with definitive treatment, total knee  replacement.  Risks of infection, DVT, component failure, need for revision surgery, neurovascular injury were reviewed in the office setting.  The postop course was reviewed stressing the efforts to maximize post-operative satisfaction and function.  Consent was obtained for benefit of pain   relief.      PROCEDURE IN DETAIL:  The patient was brought to the operative theater.   Once adequate anesthesia, preoperative antibiotics, 2 gm of Ancef,1 gm of Tranexamic Acid, and 10 mg of Decadron administered, the patient was positioned supine with a right thigh tourniquet placed.  The  right lower extremity was prepped and draped in sterile fashion.  A time-   out was performed identifying the patient, planned procedure, and the appropriate extremity.      The right lower extremity was placed in the [facility] leg holder.  The leg was   exsanguinated, tourniquet elevated to 250 mmHg.  A  midline incision was   made followed by median parapatellar arthrotomy.  Following initial   exposure, attention was first directed to the patella.  Precut   measurement was noted to be 26 mm.  I resected down to 14 mm and used a   38 anatomic patellar button to restore patellar height as well as cover the cut surface.      The lug holes were drilled and a metal shim was placed to protect the   patella from retractors and saw blade during the procedure.      At this point, attention was now directed to the femur.  The femoral   canal was opened with a drill, irrigated to try to prevent fat emboli.  An   intramedullary rod was passed at 5 degrees valgus, 9 mm of bone was   resected off the distal femur.  Following this resection, the tibia was   subluxated anteriorly.  Using the extramedullary guide, 2 mm of bone was resected off   the proximal medial tibia.  We confirmed the gap would be   stable medially and laterally with a size 6 spacer block as well as confirmed that the tibial cut was perpendicular in the coronal  plane, checking with an alignment rod.      Once this was done, I sized the femur to be a size 6 in the anterior-   posterior dimension, chose a standard component based on medial and   lateral dimension.  The size 6 rotation block was then pinned in   position anterior referenced using the C-clamp to set rotation.  The   anterior, posterior, and  chamfer cuts were made without difficulty nor   notching making certain that I was along the anterior cortex to help   with flexion gap stability.      The final box cut was made off the lateral aspect of distal femur.   Prior to tibial preparation I did remove the previous placed surgical staple just medial to his tibial tubercle from prior ACL reconstruction.     The tibia was sized to be a size 6.  The size 6 tray was   then pinned in position through the medial third of the tubercle,   drilled, and keel punched.  Trial reduction was now carried with a 6 femur,  6 tibia, a size 8 mm PS insert, and the 38 anatomic patella botton.  The knee was brought to full extension with good flexion stability with the patella   tracking through the trochlea without application of pressure.  Given   all these findings the trial components removed.  Final components were   opened and cement was mixed.  The knee was irrigated with normal saline solution and pulse lavage.  The synovial lining was   then injected with 30 cc of 0.25% Marcaine with epinephrine, 1 cc of Toradol and 30 cc of NS for a total of 61 cc.     Final implants were then cemented onto cleaned and dried cut surfaces of bone with the knee brought to extension with a size 8 mm PS trial insert.      Once the cement had fully cured, excess cement was removed   throughout the knee.  I confirmed that I was satisfied with the range of   motion and stability, and the final size 8 mm PS AOX insert was chosen.  It was   placed into the knee.      The tourniquet  had been let down at 33 minutes.  No  significant   hemostasis was required.  The extensor mechanism was then reapproximated using #1 Vicryl and #1 Stratafix sutures with the knee   in flexion.  The   remaining wound was closed with 2-0 Vicryl and running 4-0 Monocryl.   The knee was cleaned, dried, dressed sterilely using Dermabond and   Aquacel dressing.  The patient was then   brought to recovery room in stable condition, tolerating the procedure   well.   Please note that Physician Assistant, Dennie Bible, PA-C was present for the entirety of the case, and was utilized for pre-operative positioning, peri-operative retractor management, general facilitation of the procedure and for primary wound closure at the end of the case.              Madlyn Frankel Charlann Boxer, M.D.    05/21/2020 8:41 AM

## 2020-05-21 NOTE — Transfer of Care (Signed)
Immediate Anesthesia Transfer of Care Note  Patient: Brandon Gallegos  Procedure(s) Performed: TOTAL KNEE ARTHROPLASTY (Right Knee)  Patient Location: PACU  Anesthesia Type:Spinal and MAC combined with regional for post-op pain  Level of Consciousness: awake, alert , oriented and patient cooperative  Airway & Oxygen Therapy: Patient Spontanous Breathing and Patient connected to face mask oxygen  Post-op Assessment: Report given to RN and Post -op Vital signs reviewed and stable  Post vital signs: Reviewed and stable  Last Vitals:  Vitals Value Taken Time  BP 99/57 05/21/20 0910  Temp    Pulse 71 05/21/20 0912  Resp 20 05/21/20 0912  SpO2 100 % 05/21/20 0912  Vitals shown include unvalidated device data.  Last Pain:  Vitals:   05/21/20 0552  PainSc: 2       Patients Stated Pain Goal: 4 (82/57/49 3552)  Complications: No complications documented.

## 2020-05-21 NOTE — Evaluation (Signed)
Physical Therapy Evaluation Patient Details Name: Brandon Gallegos MRN: [id number] DOB: 01/02/1971 Today's Date: 05/21/2020   History of Present Illness  Patint is a 50 y.o. male s/p Rt TKA with removal of proximal tibial hardware (staple) with PMH significant for HTN, OA, and back surgery x2.    Clinical Impression  KERI TAVELLA is a 50 y.o. male POD 0 s/p Rt TKA. Patient reports independence with mobility at baseline. Patient is now limited by functional impairments (see PT problem list below) and requires min assist/guard for transfers and gait with RW. Patient was able to ambulate ~100 feet with RW and min guard/assist. Patient instructed in exercise to facilitate ROM and circulation to manage edema and reduce risk of DVT. Patient will benefit from continued skilled PT interventions to address impairments and progress towards PLOF. Acute PT will follow to progress mobility and stair training in preparation for safe discharge home.     Follow Up Recommendations Outpatient PT;Follow surgeon's recommendation for DC plan and follow-up therapies    Equipment Recommendations  Rolling walker with 5" wheels    Recommendations for Other Services       Precautions / Restrictions Precautions Precautions: Fall Restrictions Weight Bearing Restrictions: No Other Position/Activity Restrictions: WBAT      Mobility  Bed Mobility Overal bed mobility: Needs Assistance Bed Mobility: Supine to Sit     Supine to sit: Min assist     General bed mobility comments: cues for use of bed rail, no assist required to bring Rt LE off EOB.    Transfers Overall transfer level: Needs assistance Equipment used: Rolling walker (2 wheeled) Transfers: Sit to/from Stand Sit to Stand: Min guard         General transfer comment: Min guard for safety with cues for safe hand placement for power up. pt steady once standing.  Ambulation/Gait Ambulation/Gait assistance: Min assist;Min guard Gait  Distance (Feet): 100 Feet Assistive device: Rolling walker (2 wheeled) Gait Pattern/deviations: Step-to pattern;Decreased stride length;Decreased weight shift to right;Decreased stance time - right Gait velocity: decr   General Gait Details: cues for safe step pattern and proximity to RW, no buckling at Rt knee and no overt LOB noted. min assist for walker position progressing to guarding for safety.  Stairs            Wheelchair Mobility    Modified Rankin (Stroke Patients Only)       Balance Overall balance assessment: Needs assistance Sitting-balance support: Feet supported Sitting balance-Leahy Scale: Good     Standing balance support: During functional activity;Bilateral upper extremity supported Standing balance-Leahy Scale: Poor                               Pertinent Vitals/Pain Pain Assessment: 0-10 Pain Score: 5  Pain Location: Rt knee Pain Descriptors / Indicators: Aching;Dull;Sore Pain Intervention(s): Limited activity within patient's tolerance;Monitored during session;Ice applied    Home Living Family/patient expects to be discharged to:: Private residence Living Arrangements: Spouse/significant other Available Help at Discharge: Family Type of Home: Mobile home (double wide) Home Access: Stairs to enter Entrance Stairs-Rails: None Entrance Stairs-Number of Steps: 2 Home Layout: One level Home Equipment: Crutches;Shower seat;Bedside commode;Walker - standard Additional Comments: pt's father and wife can help as he recovers    Prior Function Level of Independence: Independent               Hand Dominance   Dominant Hand: Right  Extremity/Trunk Assessment   Upper Extremity Assessment Upper Extremity Assessment: Overall WFL for tasks assessed    Lower Extremity Assessment Lower Extremity Assessment: RLE deficits/detail RLE Deficits / Details: Patient with good quad activation, no extensor lag with SLR. strong  dorsi/plantar flexion 4/5 or better bil LE. RLE Sensation: WNL RLE Coordination: WNL    Cervical / Trunk Assessment Cervical / Trunk Assessment: Normal  Communication   Communication: No difficulties  Cognition Arousal/Alertness: Awake/alert Behavior During Therapy: WFL for tasks assessed/performed Overall Cognitive Status: Within Functional Limits for tasks assessed                                        General Comments      Exercises Total Joint Exercises Ankle Circles/Pumps: AROM;Both;20 reps;Seated Quad Sets: AROM;Right;5 reps;Seated Heel Slides: AROM;Right;5 reps;Seated   Assessment/Plan    PT Assessment Patient needs continued PT services  PT Problem List Decreased strength;Decreased range of motion;Decreased activity tolerance;Decreased balance;Decreased mobility;Decreased knowledge of use of DME;Decreased knowledge of precautions;Pain       PT Treatment Interventions DME instruction;Gait training;Stair training;Functional mobility training;Therapeutic activities;Therapeutic exercise;Balance training;Neuromuscular re-education;Patient/family education    PT Goals (Current goals can be found in the Care Plan section)  Acute Rehab PT Goals Patient Stated Goal: back to gardening and dirt biking PT Goal Formulation: With patient Time For Goal Achievement: 05/28/20 Potential to Achieve Goals: Good    Frequency 7X/week   Barriers to discharge        Co-evaluation               AM-PAC PT "6 Clicks" Mobility  Outcome Measure Help needed turning from your back to your side while in a flat bed without using bedrails?: None Help needed moving from lying on your back to sitting on the side of a flat bed without using bedrails?: A Little Help needed moving to and from a bed to a chair (including a wheelchair)?: A Little Help needed standing up from a chair using your arms (e.g., wheelchair or bedside chair)?: A Little Help needed to walk in  hospital room?: A Little Help needed climbing 3-5 steps with a railing? : A Little 6 Click Score: 19    End of Session Equipment Utilized During Treatment: Gait belt Activity Tolerance: Patient tolerated treatment well Patient left: in chair;with call bell/phone within reach;with chair alarm set;with family/visitor present Nurse Communication: Mobility status;Patient requests pain meds PT Visit Diagnosis: Muscle weakness (generalized) (M62.81);Pain;Difficulty in walking, not elsewhere classified (R26.2) Pain - Right/Left: Right Pain - part of body: Knee    Time: 1245-1311 PT Time Calculation (min) (ACUTE ONLY): 26 min   Charges:   PT Evaluation $PT Eval Low Complexity: 1 Low PT Treatments $Gait Training: 8-22 mins        Wynn Maudlin, DPT Acute Rehabilitation Services Office [phone] Pager [phone]    Anitra Lauth 05/21/2020, 1:41 PM

## 2020-05-21 NOTE — Anesthesia Procedure Notes (Signed)
Spinal  Patient location during procedure: OR Start time: 05/21/2020 7:21 AM End time: 05/21/2020 7:25 AM Reason for block: surgical anesthesia Staffing Performed: anesthesiologist  Anesthesiologist: Mal Amabile, MD Preanesthetic Checklist Completed: patient identified, IV checked, site marked, risks and benefits discussed, surgical consent, monitors and equipment checked, pre-op evaluation and timeout performed Spinal Block Patient position: sitting Prep: DuraPrep and site prepped and draped Patient monitoring: heart rate, cardiac monitor, continuous pulse ox and blood pressure Approach: midline Location: L3-4 Injection technique: single-shot Needle Needle type: Pencan  Needle gauge: 24 G Needle length: 9 cm Needle insertion depth: 7 cm Assessment Sensory level: T6 Events: CSF return Additional Notes Patient tolerated procedure well. Adequate sensory level.

## 2020-05-21 NOTE — Progress Notes (Incomplete)
At shift change, I informed the patient's spouse that visiting hours were over at 8 p.m. and the patient stated they lived an hour away. The patient and his wife wanted the wife to stay in the room overnight. I informed the patient and the wife that they could discuss it with the [facility]. They were agreeable. I informed the [facility] that the patient stated he lived an hour away and wanted his wife to stay. The [facility] allowed the wife to stay since they lived in [place] and the patient to the [facility] his wife does not know her way around. When I entered the room at approximately 21:50 to administered medications and the antibiotic to the patient, the patient stated "Ypu made a liar out of me". I asked the patient what he was referring to and told him I had no idea what he was talking about. The patient stated "You told the manager I lived a long way away". I stated I told the [facility] I lived an hour away. The patient then refused all medications, stating "

## 2020-05-21 NOTE — Addendum Note (Signed)
Addendum  created 05/21/20 0956 by Mal Amabile, MD   Intraprocedure Meds edited

## 2020-05-21 NOTE — Anesthesia Postprocedure Evaluation (Signed)
Anesthesia Post Note  Patient: Brandon Gallegos  Procedure(s) Performed: TOTAL KNEE ARTHROPLASTY (Right Knee)     Patient location during evaluation: PACU Anesthesia Type: Spinal Level of consciousness: oriented and awake and alert Pain management: pain level controlled Vital Signs Assessment: post-procedure vital signs reviewed and stable Respiratory status: spontaneous breathing, respiratory function stable and nonlabored ventilation Cardiovascular status: blood pressure returned to baseline and stable Postop Assessment: no headache, no backache, no apparent nausea or vomiting, spinal receding and patient able to bend at knees Anesthetic complications: no   No complications documented.  Last Vitals:  Vitals:   05/21/20 0930 05/21/20 0945  BP: 96/63 90/69  Pulse: 71 66  Resp: 19 (!) 9  Temp:    SpO2: 92% 93%    Last Pain:  Vitals:   05/21/20 0945  PainSc: 0-No pain                 Huong Luthi A.

## 2020-05-21 NOTE — Interval H&P Note (Signed)
History and Physical Interval Note:  05/21/2020 7:05 AM  Brandon Gallegos  has presented today for surgery, with the diagnosis of Right knee osteoarthritis.  The various methods of treatment have been discussed with the patient and family. After consideration of risks, benefits and other options for treatment, the patient has consented to  Procedure(s) with comments: TOTAL KNEE ARTHROPLASTY (Right) - 70 mins as a surgical intervention.  The patient's history has been reviewed, patient examined, no change in status, stable for surgery.  I have reviewed the patient's chart and labs.  Questions were answered to the patient's satisfaction.     [name]

## 2020-05-21 NOTE — Discharge Instructions (Signed)
INSTRUCTIONS AFTER JOINT REPLACEMENT    Please Hold your [place] for 2 weeks following surgery. Then resume at home dose.   o Remove items at home which could result in a fall. This includes throw rugs or furniture in walking pathways o ICE to the affected joint every three hours while awake for 30 minutes at a time, for at least the first 3-5 days, and then as needed for pain and swelling.  Continue to use ice for pain and swelling. You may notice swelling that will progress down to the foot and ankle.  This is normal after surgery.  Elevate your leg when you are not up walking on it.   o Continue to use the breathing machine you got in the hospital (incentive spirometer) which will help keep your temperature down.  It is common for your temperature to cycle up and down following surgery, especially at night when you are not up moving around and exerting yourself.  The breathing machine keeps your lungs expanded and your temperature down.   DIET:  As you were doing prior to hospitalization, we recommend a well-balanced diet.  DRESSING / WOUND CARE / SHOWERING  Keep the surgical dressing until follow up.  The dressing is water proof, so you can shower without any extra covering.  IF THE DRESSING FALLS OFF or the wound gets wet inside, change the dressing with sterile gauze.  Please use good hand washing techniques before changing the dressing.  Do not use any lotions or creams on the incision until instructed by your surgeon.    ACTIVITY  o Increase activity slowly as tolerated, but follow the weight bearing instructions below.   o No driving for 6 weeks or until further direction given by your physician.  You cannot drive while taking narcotics.  o No lifting or carrying greater than 10 lbs. until further directed by your surgeon. o Avoid periods of inactivity such as sitting longer than an hour when not asleep. This helps prevent blood clots.  o You may return to work once you are authorized  by your doctor.     WEIGHT BEARING   Weight bearing as tolerated with assist device (walker, cane, etc) as directed, use it as long as suggested by your surgeon or therapist, typically at least 4-6 weeks.   EXERCISES  Results after joint replacement surgery are often greatly improved when you follow the exercise, range of motion and muscle strengthening exercises prescribed by your doctor. Safety measures are also important to protect the joint from further injury. Any time any of these exercises cause you to have increased pain or swelling, decrease what you are doing until you are comfortable again and then slowly increase them. If you have problems or questions, call your caregiver or physical therapist for advice.   Rehabilitation is important following a joint replacement. After just a few days of immobilization, the muscles of the leg can become weakened and shrink (atrophy).  These exercises are designed to build up the tone and strength of the thigh and leg muscles and to improve motion. Often times heat used for twenty to thirty minutes before working out will loosen up your tissues and help with improving the range of motion but do not use heat for the first two weeks following surgery (sometimes heat can increase post-operative swelling).   These exercises can be done on a training (exercise) mat, on the floor, on a table or on a bed. Use whatever works the best and is  most comfortable for you.    Use music or television while you are exercising so that the exercises are a pleasant break in your day. This will make your life better with the exercises acting as a break in your routine that you can look forward to.   Perform all exercises about fifteen times, three times per day or as directed.  You should exercise both the operative leg and the other leg as well.  Exercises include:   . Quad Sets - Tighten up the muscle on the front of the thigh (Quad) and hold for 5-10 seconds.    . Straight Leg Raises - With your knee straight (if you were given a brace, keep it on), lift the leg to 60 degrees, hold for 3 seconds, and slowly lower the leg.  Perform this exercise against resistance later as your leg gets stronger.  . Leg Slides: Lying on your back, slowly slide your foot toward your buttocks, bending your knee up off the floor (only go as far as is comfortable). Then slowly slide your foot back down until your leg is flat on the floor again.  Lawanna Kobus Wings: Lying on your back spread your legs to the side as far apart as you can without causing discomfort.  . Hamstring Strength:  Lying on your back, push your heel against the floor with your leg straight by tightening up the muscles of your buttocks.  Repeat, but this time bend your knee to a comfortable angle, and push your heel against the floor.  You may put a pillow under the heel to make it more comfortable if necessary.   A rehabilitation program following joint replacement surgery can speed recovery and prevent re-injury in the future due to weakened muscles. Contact your doctor or a physical therapist for more information on knee rehabilitation.    CONSTIPATION  Constipation is defined medically as fewer than three stools per week and severe constipation as less than one stool per week.  Even if you have a regular bowel pattern at home, your normal regimen is likely to be disrupted due to multiple reasons following surgery.  Combination of anesthesia, postoperative narcotics, change in appetite and fluid intake all can affect your bowels.   YOU MUST use at least one of the following options; they are listed in order of increasing strength to get the job done.  They are all available over the counter, and you may need to use some, POSSIBLY even all of these options:    Drink plenty of fluids (prune juice may be helpful) and high fiber foods Colace 100 mg by mouth twice a day  Senokot for constipation as directed and as  needed Dulcolax (bisacodyl), take with full glass of water  Miralax (polyethylene glycol) once or twice a day as needed.  If you have tried all these things and are unable to have a bowel movement in the first 3-4 days after surgery call either your surgeon or your primary doctor.    If you experience loose stools or diarrhea, hold the medications until you stool forms back up.  If your symptoms do not get better within 1 week or if they get worse, check with your doctor.  If you experience "the worst abdominal pain ever" or develop nausea or vomiting, please contact the office immediately for further recommendations for treatment.   ITCHING:  If you experience itching with your medications, try taking only a single pain pill, or even half a pain pill  at a time.  You can also use Benadryl over the counter for itching or also to help with sleep.   TED HOSE STOCKINGS:  Use stockings on both legs until for at least 2 weeks or as directed by physician office. They may be removed at night for sleeping.  MEDICATIONS:  See your medication summary on the "After Visit Summary" that nursing will review with you.  You may have some home medications which will be placed on hold until you complete the course of blood thinner medication.  It is important for you to complete the blood thinner medication as prescribed.  PRECAUTIONS:  If you experience chest pain or shortness of breath - call 911 immediately for transfer to the hospital emergency department.   If you develop a fever greater that 101 F, purulent drainage from wound, increased redness or drainage from wound, foul odor from the wound/dressing, or calf pain - CONTACT YOUR SURGEON.                                                   FOLLOW-UP APPOINTMENTS:  If you do not already have a post-op appointment, please call the office for an appointment to be seen by your surgeon.  Guidelines for how soon to be seen are listed in your "After Visit Summary", but  are typically between 1-4 weeks after surgery.  OTHER INSTRUCTIONS:   Knee Replacement:  Do not place pillow under knee, focus on keeping the knee straight while resting. CPM instructions: 0-90 degrees, 2 hours in the morning, 2 hours in the afternoon, and 2 hours in the evening. Place foam block, curve side up under heel at all times except when in CPM or when walking.  DO NOT modify, tear, cut, or change the foam block in any way.  POST-OPERATIVE OPIOID TAPER INSTRUCTIONS: . It is important to wean off of your opioid medication as soon as possible. If you do not need pain medication after your surgery it is ok to stop day one. Marland Kitchen. Opioids include: o Codeine, Hydrocodone(Norco, Vicodin), Oxycodone(Percocet, oxycontin) and hydromorphone amongst others.  . Long term and even short term use of opiods can cause: o Increased pain response o Dependence o Constipation o Depression o Respiratory depression o And more.  . Withdrawal symptoms can include o Flu like symptoms o Nausea, vomiting o And more . Techniques to manage these symptoms o Hydrate well o Eat regular healthy meals o Stay active o Use relaxation techniques(deep breathing, meditating, yoga) . Do Not substitute Alcohol to help with tapering . If you have been on opioids for less than two weeks and do not have pain than it is ok to stop all together.  . Plan to wean off of opioids o This plan should start within one week post op of your joint replacement. o Maintain the same interval or time between taking each dose and first decrease the dose.  o Cut the total daily intake of opioids by one tablet each day o Next start to increase the time between doses. o The last dose that should be eliminated is the evening dose.     MAKE SURE YOU:  . Understand these instructions.  . Get help right away if you are not doing well or get worse.    Thank you for letting us be a part of your  medical care team.  It is a privilege we  respect greatly.  We hope these instructions will help you stay on track for a fast and full recovery!

## 2020-05-21 NOTE — Anesthesia Procedure Notes (Signed)
Anesthesia Regional Block: Adductor canal block   Pre-Anesthetic Checklist: ,, timeout performed, Correct Patient, Correct Site, Correct Laterality, Correct Procedure, Correct Position, site marked, Risks and benefits discussed,  Surgical consent,  Pre-op evaluation,  At surgeon's request and post-op pain management  Laterality: Right  Prep: chloraprep       Needles:  Injection technique: Single-shot  Needle Type: Echogenic Stimulator Needle     Needle Length: 9cm  Needle Gauge: 21   Needle insertion depth: 7 cm   Additional Needles:   Procedures:,,,, ultrasound used (permanent image in chart),,,,  Narrative:  Start time: 05/21/2020 7:53 AM End time: 05/21/2020 7:58 AM Injection made incrementally with aspirations every 5 mL.  Performed by: Personally  Anesthesiologist: Mal Amabile, MD  Additional Notes: Timeout performed. Patient sedated. Relevant anatomy ID'd using [place]. Incremental 2-39ml injection of LA with frequent aspiration. Patient tolerated procedure well.        Right Adductor Canal Block

## 2020-05-21 NOTE — Progress Notes (Signed)
At shift change, I informed the patient's spouse that visiting hours were over at 8 p.m. and the patient stated they lived an hour away. The patient and his wife wanted the wife to stay in the room overnight. I informed the patient and the wife that they could discuss it with the [facility]. They were agreeable. I informed the [facility] that the patient stated he lived an hour away and wanted his wife to stay. The [facility] allowed the wife to stay since they lived in [place] and the patient told the [facility] his wife does not know her way around. When I entered the room at approximately 21:50 to administered medications and the antibiotic to the patient, the patient stated "Ypu made a liar out of me". I asked the patient what he was referring to and told him I had no idea what he was talking about. The patient stated "You told the manager I lived a long way away". I stated I told the [facility] you lived an hour away. The patient then refused all medications, stating "I'm not gonna take anything from you". Then I asked If I could give the antibiotic, and the patient stated "No. I'm gonna sit right here in this chair and my uncle is coming in the morning". I offered the patient another nurse twice and the patient refused. I asked Reginia Forts if he would take over that patient's care and he agreed.

## 2020-05-22 ENCOUNTER — Encounter (HOSPITAL_COMMUNITY): Payer: Self-pay | Admitting: Orthopedic Surgery

## 2020-05-22 DIAGNOSIS — M1711 Unilateral primary osteoarthritis, right knee: Secondary | ICD-10-CM | POA: Diagnosis not present

## 2020-05-22 LAB — CBC
HCT: 38.3 % — ABNORMAL LOW (ref 39.0–52.0)
Hemoglobin: 12.7 g/dL — ABNORMAL LOW (ref 13.0–17.0)
MCH: 30.2 pg (ref 26.0–34.0)
MCHC: 33.2 g/dL (ref 30.0–36.0)
MCV: 91 fL (ref 80.0–100.0)
Platelets: 177 10*3/uL (ref 150–400)
RBC: 4.21 MIL/uL — ABNORMAL LOW (ref 4.22–5.81)
RDW: 13.1 % (ref 11.5–15.5)
WBC: 12.3 10*3/uL — ABNORMAL HIGH (ref 4.0–10.5)
nRBC: 0 % (ref 0.0–0.2)

## 2020-05-22 LAB — BASIC METABOLIC PANEL
Anion gap: 10 (ref 5–15)
BUN: 15 mg/dL (ref 6–20)
CO2: 22 mmol/L (ref 22–32)
Calcium: 8.6 mg/dL — ABNORMAL LOW (ref 8.9–10.3)
Chloride: 104 mmol/L (ref 98–111)
Creatinine, Ser: 0.96 mg/dL (ref 0.61–1.24)
GFR, Estimated: >60 mL/min (ref >60)
Glucose, Bld: 142 mg/dL — ABNORMAL HIGH (ref 70–99)
Potassium: 3.7 mmol/L (ref 3.5–5.1)
Sodium: 136 mmol/L (ref 135–145)

## 2020-05-22 MED ORDER — CELECOXIB 200 MG PO CAPS: 200.0000 mg | ORAL_CAPSULE | Freq: Two times a day (BID) | ORAL | 0 refills | Status: AC

## 2020-05-22 MED ORDER — FERROUS SULFATE 325 (65 FE) MG PO TABS: 325.0000 mg | ORAL_TABLET | Freq: Three times a day (TID) | ORAL | 0 refills | Status: AC

## 2020-05-22 MED ORDER — OXYCODONE HCL 5 MG PO TABS: 5.0000 mg | ORAL_TABLET | ORAL | 0 refills | Status: AC | PRN

## 2020-05-22 MED ORDER — METHOCARBAMOL 500 MG PO TABS: 500.0000 mg | ORAL_TABLET | Freq: Four times a day (QID) | ORAL | 0 refills | Status: AC | PRN

## 2020-05-22 MED ORDER — ASPIRIN 81 MG PO CHEW: 81.0000 mg | CHEWABLE_TABLET | Freq: Two times a day (BID) | ORAL | 0 refills | Status: AC

## 2020-05-22 MED ORDER — DOCUSATE SODIUM 100 MG PO CAPS: 100.0000 mg | ORAL_CAPSULE | Freq: Two times a day (BID) | ORAL | 0 refills | Status: AC

## 2020-05-22 MED ORDER — ACETAMINOPHEN 325 MG PO TABS
1000.0000 mg | ORAL_TABLET | Freq: Three times a day (TID) | ORAL | Status: AC | PRN
Start: 2020-05-22 — End: ?

## 2020-05-22 MED ORDER — POLYETHYLENE GLYCOL 3350 17 G PO PACK: 17.0000 g | PACK | Freq: Every day | ORAL | 0 refills | Status: AC | PRN

## 2020-05-22 NOTE — Plan of Care (Signed)
Plan of care reviewed and discussed with the patient. 

## 2020-05-22 NOTE — Progress Notes (Signed)
Physical Therapy Treatment Patient Details Name: Brandon Gallegos MRN: [id number] DOB: [birth year] Today's Date: 05/22/2020    History of Present Illness Patient is a 50 y.o. male s/p Rt TKA with removal of proximal tibial hardware (staple) with PMH significant for HTN, OA, and back surgery x2.    PT Comments    Pt assisted with ambulating, practicing safe stair technique and performing LE exercises.  Pt provided with HEP and stair handouts.  Spouse present and observed session.  Pt and spouse had no further questions, and pt feels ready for d/c home today.   Follow Up Recommendations  Outpatient PT;Follow surgeon's recommendation for DC plan and follow-up therapies     Equipment Recommendations  Rolling walker with 5" wheels    Recommendations for Other Services       Precautions / Restrictions Precautions Precautions: Fall;Knee Restrictions Other Position/Activity Restrictions: WBAT    Mobility  Bed Mobility Overal bed mobility: Needs Assistance Bed Mobility: Supine to Sit     Supine to sit: Supervision          Transfers Overall transfer level: Needs assistance Equipment used: Rolling walker (2 wheeled) Transfers: Sit to/from Stand Sit to Stand: Min guard         General transfer comment: min/guard for safety, verbal cues for UE and LE positioning  Ambulation/Gait Ambulation/Gait assistance: Min guard Gait Distance (Feet): 160 Feet Assistive device: Rolling walker (2 wheeled) Gait Pattern/deviations: Step-to pattern;Decreased stance time - right;Step-through pattern;Antalgic     General Gait Details: verbal cues for sequence, RW positioning, step length   Stairs Stairs: Yes Stairs assistance: Min guard Stair Management: Step to pattern;Backwards;With walker Number of Stairs: 2 General stair comments: verbal cues for sequence, RW positioning, safety; performed twice, spouse present and assisted with holding RW, pt and spouse report  understanding   Wheelchair Mobility    Modified Rankin (Stroke Patients Only)       Balance                                            Cognition Arousal/Alertness: Awake/alert Behavior During Therapy: WFL for tasks assessed/performed Overall Cognitive Status: Within Functional Limits for tasks assessed                                        Exercises Total Joint Exercises Ankle Circles/Pumps: AROM;Both;10 reps Quad Sets: AROM;Right;10 reps Heel Slides: Right;AAROM;10 reps Hip ABduction/ADduction: AAROM;10 reps;Right Straight Leg Raises: AAROM;Right;10 reps    General Comments        Pertinent Vitals/Pain Pain Assessment: 0-10 Pain Score: 5  Pain Location: Rt knee Pain Descriptors / Indicators: Aching;Sore Pain Intervention(s): Monitored during session;Repositioned    Home Living                      Prior Function            PT Goals (current goals can now be found in the care plan section) Progress towards PT goals: Progressing toward goals    Frequency    7X/week      PT Plan Current plan remains appropriate    Co-evaluation              AM-PAC PT "6 Clicks" Mobility   Outcome Measure  Help needed turning  from your back to your side while in a flat bed without using bedrails?: None Help needed moving from lying on your back to sitting on the side of a flat bed without using bedrails?: A Little Help needed moving to and from a bed to a chair (including a wheelchair)?: A Little Help needed standing up from a chair using your arms (e.g., wheelchair or bedside chair)?: A Little Help needed to walk in hospital room?: A Little Help needed climbing 3-5 steps with a railing? : A Little 6 Click Score: 19    End of Session Equipment Utilized During Treatment: Gait belt Activity Tolerance: Patient tolerated treatment well Patient left: in chair;with call bell/phone within reach;with family/visitor  present Nurse Communication: Mobility status PT Visit Diagnosis: Muscle weakness (generalized) (M62.81);Difficulty in walking, not elsewhere classified (R26.2)     Time: 0071-2197 PT Time Calculation (min) (ACUTE ONLY): 29 min  Charges:  $Gait Training: 8-22 mins $Therapeutic Exercise: 8-22 mins                     Thomasene Mohair PT, DPT Acute Rehabilitation Services Pager: [phone] Office: [phone]  Brandon Gallegos 05/22/2020, 12:37 PM

## 2020-05-22 NOTE — Progress Notes (Addendum)
   Subjective: 1 Day Post-Op Procedure(s) (LRB): TOTAL KNEE ARTHROPLASTY (Right) Patient reports pain as mild.   Patient seen in rounds for Dr. Charlann Boxer. Patient is resting in bed this morning with wife at the bedside. He is eating breakfast, and reports he is feeling well. Foley catheter removed. Ambulated 100 feet with PT yesterday. He tried to hold off on pain meds for a period of time, but is having increased pain now.   We will continue therapy today.   Objective: Vital signs in last 24 hours: Temp:  [97.6 F (36.4 C)-98.5 F (36.9 C)] 98 F (36.7 C) (03/23 0515) Pulse Rate:  [43-92] 59 (03/23 0515) Resp:  [9-19] 18 (03/23 0515) BP: (90-133)/(53-87) 128/75 (03/23 0515) SpO2:  [92 %-100 %] 98 % (03/23 0515)  Intake/Output from previous day:  Intake/Output Summary (Last 24 hours) at 05/22/2020 0811 Last data filed at 05/22/2020 0805 Gross per 24 hour  Intake 3723 ml  Output 2525 ml  Net 1198 ml     Intake/Output this shift: Total I/O In: 240 [P.O.:240] Out: 0   Labs: No results for input(s): HGB in the last 72 hours. No results for input(s): WBC, RBC, HCT, PLT in the last 72 hours. No results for input(s): NA, K, CL, CO2, BUN, CREATININE, GLUCOSE, CALCIUM in the last 72 hours. No results for input(s): LABPT, INR in the last 72 hours.  Exam: General - Patient is Alert and Oriented Extremity - Neurologically intact Sensation intact distally Intact pulses distally Dorsiflexion/Plantar flexion intact Dressing - dressing C/D/I Motor Function - intact, moving foot and toes well on exam.   Past Medical History:  Diagnosis Date  . Arthritis   . Hypertension     Assessment/Plan: 1 Day Post-Op Procedure(s) (LRB): TOTAL KNEE ARTHROPLASTY (Right) Active Problems:   S/P total knee arthroplasty, right   S/P revision of total knee, right  Estimated body mass index is 33.6 kg/m as calculated from the following:   Height as of this encounter: 5\' 8"  (1.727 m).   Weight as  of this encounter: 100.2 kg. Advance diet Up with therapy D/C IV fluids   Patient's anticipated LOS is less than 2 midnights, meeting these requirements: - Younger than 38 - Lives within 1 hour of care - Has a competent adult at home to recover with post-op recover - NO history of  - Chronic pain requiring opiods  - Diabetes  - Coronary Artery Disease  - Heart failure  - Heart attack  - Stroke  - DVT/VTE  - Cardiac arrhythmia  - Respiratory Failure/COPD  - Renal failure  - Anemia  - Advanced Liver disease  DVT Prophylaxis - Aspirin Weight bearing as tolerated.  Plan is to go Home after hospital stay. Plan for discharge today following 1-2 sessions of PT as long as he is meeting his goals. Patient is scheduled for OPPT. Follow up in the office in 2 weeks.   76, PA-C Orthopedic Surgery [phone] 05/22/2020, 8:11 AM

## 2020-05-22 NOTE — TOC Transition Note (Signed)
Transition of Care [facility]) - CM/SW Discharge Note  Patient Details  Name: Brandon Gallegos MRN: [id number] Date of Birth: [birth year]  Transition of Care [facility] CM/SW Contact:  Sherie Don, LCSW Phone Number: 05/22/2020, 10:20 AM  Clinical Narrative: Patient to discharge today. CSW met with patient to confirm discharge plan. Patient has been referred for OPPT and will need both a RW and 3N1. [employer] to deliver DME to patient's room at it was ordered prior to surgery. TOC signing off.  Final next level of care: OP Rehab Barriers to Discharge: No Barriers Identified  Patient Goals and CMS Choice Patient states their goals for this hospitalization and ongoing recovery are:: Discharge home CMS Medicare.gov Compare Post Acute Care list provided to:: Patient Choice offered to / list presented to : Patient  Discharge Plan and Services         DME Arranged: 3-N-1,Walker rolling DME Agency: [employer] Date DME Agency Contacted: 05/22/20 Representative spoke with at DME Agency: Startup prior to surgery Ovid Curd)  Readmission Risk Interventions No flowsheet data found.

## 2020-05-27 NOTE — Discharge Summary (Signed)
Physician Discharge Summary   Patient ID: Brandon Gallegos MRN: [id number] DOB/AGE: 1970-12-22 50 y.o.  Admit date: 05/21/2020 Discharge date: 05/22/2020  Primary Diagnosis: Right knee osteoarthritis  Admission Diagnoses:  Past Medical History:  Diagnosis Date  . Arthritis   . Hypertension    Discharge Diagnoses:   Active Problems:   S/P total knee arthroplasty, right   S/P revision of total knee, right  Estimated body mass index is 33.6 kg/m as calculated from the following:   Height as of this encounter: 5\' 8"  (1.727 m).   Weight as of this encounter: 100.2 kg.  Procedure:  Procedure(s) (LRB): TOTAL KNEE ARTHROPLASTY (Right)   Consults: None  HPI:  Brandon Gallegos is a 50 y.o. male patient of   mine.  The patient had been seen, evaluated, and treated for months conservatively in the   office with medication, activity modification, and injections.  The patient had   radiographic changes of bone-on-bone arthritis with endplate sclerosis and osteophytes noted.  Based on the radiographic changes and failed conservative measures, the patient   decided to proceed with definitive treatment, total knee replacement.  Risks of infection, DVT, component failure, need for revision surgery, neurovascular injury were reviewed in the office setting.  The postop course was reviewed stressing the efforts to maximize post-operative satisfaction and function.  Consent was obtained for benefit of pain   relief.   Laboratory Data: Admission on 05/21/2020, Discharged on 05/22/2020  Component Date Value Ref Range Status  . ABO/RH(D) 05/21/2020    Final                   Value:A POS Performed at [facility], [address], [place], [place]   . Sodium 05/22/2020 136  135 - 145 mmol/L Final  . Potassium 05/22/2020 3.7  3.5 - 5.1 mmol/L Final  . Chloride 05/22/2020 104  98 - 111 mmol/L Final  . CO2 05/22/2020 22  22 - 32 mmol/L Final  . Glucose, Bld  05/22/2020 142* 70 - 99 mg/dL Final   Glucose reference range applies only to samples taken after fasting for at least 8 hours.  . BUN 05/22/2020 15  6 - 20 mg/dL Final  . Creatinine, Ser 05/22/2020 0.96  0.61 - 1.24 mg/dL Final  . Calcium 05/24/2020 8.6* 8.9 - 10.3 mg/dL Final  . GFR, Estimated 05/22/2020 >60  >60 mL/min Final   Comment: (NOTE) Calculated using the CKD-EPI Creatinine Equation (2021)   . Anion gap 05/22/2020 10  5 - 15 Final   Performed at [facility], [address], [place], [place]  . WBC 05/22/2020 12.3* 4.0 - 10.5 K/uL Final  . RBC 05/22/2020 4.21* 4.22 - 5.81 MIL/uL Final  . Hemoglobin 05/22/2020 12.7* 13.0 - 17.0 g/dL Final  . HCT 05/24/2020 38.3* 39.0 - 52.0 % Final  . MCV 05/22/2020 91.0  80.0 - 100.0 fL Final  . MCH 05/22/2020 30.2  26.0 - 34.0 pg Final  . MCHC 05/22/2020 33.2  30.0 - 36.0 g/dL Final  . RDW 05/24/2020 13.1  11.5 - 15.5 % Final  . Platelets 05/22/2020 177  150 - 400 K/uL Final  . nRBC 05/22/2020 0.0  0.0 - 0.2 % Final   Performed at [facility], [address], [place], [place]  Hospital Outpatient Visit on 05/17/2020  Component Date Value Ref Range Status  . SARS Coronavirus 2 05/17/2020 NEGATIVE  NEGATIVE Final   Comment: (  NOTE) SARS-CoV-2 target nucleic acids are NOT DETECTED.  The SARS-CoV-2 RNA is generally detectable in upper and lower respiratory specimens during the acute phase of infection. Negative results do not preclude SARS-CoV-2 infection, do not rule out co-infections with other pathogens, and should not be used as the sole basis for treatment or other patient management decisions. Negative results must be combined with clinical observations, patient history, and epidemiological information. The expected result is Negative.  Fact Sheet for Patients: HairSlick.no  Fact Sheet for Healthcare  Providers: quierodirigir.com  This test is not yet approved or cleared by the [place] FDA and  has been authorized for detection and/or diagnosis of SARS-CoV-2 by FDA under an Emergency Use Authorization (EUA). This EUA will remain  in effect (meaning this test can be used) for the duration of the COVID-19 declaration under Se                          ction 564(b)(1) of the Act, 21 U.S.C. section 360bbb-3(b)(1), unless the authorization is terminated or revoked sooner.  Performed at [facility], [address], [place], [place] [address]   Hospital Outpatient Visit on 05/13/2020  Component Date Value Ref Range Status  . ABO/RH(D) 05/13/2020 A POS   Final  . Antibody Screen 05/13/2020 NEG   Final  . Sample Expiration 05/13/2020 05/24/2020,2359   Final  . Extend sample reason 05/13/2020    Final                   Value:NO TRANSFUSIONS OR PREGNANCY IN THE PAST 3 MONTHS Performed at [facility], [address], [place], [place] [address]   . WBC 05/13/2020 7.2  4.0 - 10.5 K/uL Final  . RBC 05/13/2020 5.26  4.22 - 5.81 MIL/uL Final  . Hemoglobin 05/13/2020 15.8  13.0 - 17.0 g/dL Final  . HCT 30/94/0768 48.7  39.0 - 52.0 % Final  . MCV 05/13/2020 92.6  80.0 - 100.0 fL Final  . MCH 05/13/2020 30.0  26.0 - 34.0 pg Final  . MCHC 05/13/2020 32.4  30.0 - 36.0 g/dL Final  . RDW 08/81/1031 13.2  11.5 - 15.5 % Final  . Platelets 05/13/2020 205  150 - 400 K/uL Final  . nRBC 05/13/2020 0.0  0.0 - 0.2 % Final   Performed at [facility], [address] [place], [place] [address]  . Sodium 05/13/2020 141  135 - 145 mmol/L Final  . Potassium 05/13/2020 4.9  3.5 - 5.1 mmol/L Final  . Chloride 05/13/2020 105  98 - 111 mmol/L Final  . CO2 05/13/2020 27  22 - 32 mmol/L Final  . Glucose, Bld 05/13/2020 104* 70 - 99 mg/dL Final   Glucose reference range applies only to samples taken after fasting for at least 8 hours.  . BUN  05/13/2020 14  6 - 20 mg/dL Final  . Creatinine, Ser 05/13/2020 1.14  0.61 - 1.24 mg/dL Final  . Calcium 59/29/2446 10.2  8.9 - 10.3 mg/dL Final  . GFR, Estimated 05/13/2020 >60  >60 mL/min Final   Comment: (NOTE) Calculated using the CKD-EPI Creatinine Equation (2021)   . Anion gap 05/13/2020 9  5 - 15 Final   Performed at [facility], [address] [place], [place] [address]  . MRSA, PCR 05/13/2020 NEGATIVE  NEGATIVE Final  . Staphylococcus aureus 05/13/2020 NEGATIVE  NEGATIVE Final   Comment: (NOTE) The Xpert SA Assay (FDA  approved for NASAL specimens in patients 69 years of age and older), is one component of a comprehensive surveillance program. It is not intended to diagnose infection nor to guide or monitor treatment. Performed at [facility], [address], [place], [place] [address]      X-Rays:No results found.  EKG: Orders placed or performed during the hospital encounter of 05/13/20  . EKG 12 lead per protocol  . EKG 12 lead per protocol     Hospital Course: ALMIN LIVINGSTONE is a 50 y.o. who was admitted to [facility]. They were brought to the operating room on 05/21/2020 and underwent Procedure(s): TOTAL KNEE ARTHROPLASTY.  Patient tolerated the procedure well and was later transferred to the recovery room and then to the orthopaedic floor for postoperative care. They were given PO and IV analgesics for pain control following their surgery. They were given 24 hours of postoperative antibiotics of  Anti-infectives (From admission, onward)   Start     Dose/Rate Route Frequency Ordered Stop   05/21/20 1400  ceFAZolin (ANCEF) IVPB 2g/100 mL premix        2 g 200 mL/hr over 30 Minutes Intravenous Every 6 hours 05/21/20 1047 05/22/20 0159   05/21/20 0600  ceFAZolin (ANCEF) IVPB 2g/100 mL premix        2 g 200 mL/hr over 30 Minutes Intravenous On call to O.R. 05/21/20 0543 05/21/20 0726     and started on DVT  prophylaxis in the form of Aspirin.   PT and OT were ordered for total joint protocol. Discharge planning consulted to help with postop disposition and equipment needs.  Patient had a good night on the evening of surgery. They started to get up OOB with therapy on POD #0. Pt was seen during rounds and was ready to go home pending progress with therapy.He worked with therapy on POD #1 and was meeting his goals. Pt was discharged to home later that day in stable condition.  Diet: Regular diet Activity: WBAT Follow-up: in 2 weeks Disposition: Home Discharged Condition: good   Discharge Instructions    Call MD / Call 911   Complete by: As directed    If you experience chest pain or shortness of breath, CALL 911 and be transported to the hospital emergency room.  If you develope a fever above 101 F, pus (white drainage) or increased drainage or redness at the wound, or calf pain, call your surgeon's office.   Change dressing   Complete by: As directed    Maintain surgical dressing until follow up in the clinic. If the edges start to pull up, may reinforce with tape. If the dressing is no longer working, may remove and cover with gauze and tape, but must keep the area dry and clean.  Call with any questions or concerns.   Constipation Prevention   Complete by: As directed    Drink plenty of fluids.  Prune juice may be helpful.  You may use a stool softener, such as Colace (over the counter) 100 mg twice a day.  Use MiraLax (over the counter) for constipation as needed.   Diet - low sodium heart healthy   Complete by: As directed    Discharge instructions   Complete by: As directed    Maintain surgical dressing until follow up in the clinic. If the edges start to pull up, may reinforce with tape. If the dressing is no longer working, may remove and cover with gauze and tape, but  must keep the area dry and clean.  [facility] Call with any questions or concerns.   Increase  activity slowly as tolerated   Complete by: As directed    Weight bearing as tolerated with assist device (walker, cane, etc) as directed, use it as long as suggested by your surgeon or therapist, typically at least 4-6 weeks.   TED hose   Complete by: As directed    Use stockings (TED hose) for 2 weeks on both leg(s).  You may remove them at night for sleeping.     Allergies as of 05/22/2020   No Known Allergies     Medication List    STOP taking these medications   Otezla 30 MG Tabs Generic drug: Apremilast     TAKE these medications   acetaminophen 325 MG tablet Commonly known as: TYLENOL Take 3 tablets (975 mg total) by mouth every 8 (eight) hours as needed for mild pain.   aspirin 81 MG chewable tablet Chew 1 tablet (81 mg total) by mouth 2 (two) times daily for 28 days.   celecoxib 200 MG capsule Commonly known as: CELEBREX Take 1 capsule (200 mg total) by mouth 2 (two) times daily.   docusate sodium 100 MG capsule Commonly known as: COLACE Take 1 capsule (100 mg total) by mouth 2 (two) times daily.   ferrous sulfate 325 (65 FE) MG tablet Take 1 tablet (325 mg total) by mouth 3 (three) times daily after meals for 14 days.   GOODY HEADACHE PO Take 1 packet by mouth daily as needed (pain).   lisinopril 20 MG tablet Commonly known as: ZESTRIL Take 20 mg by mouth daily.   methocarbamol 500 MG tablet Commonly known as: ROBAXIN Take 1 tablet (500 mg total) by mouth every 6 (six) hours as needed for muscle spasms.   oxyCODONE 5 MG immediate release tablet Commonly known as: Oxy IR/ROXICODONE Take 1-2 tablets (5-10 mg total) by mouth every 4 (four) hours as needed for moderate pain (pain score 4-6).   polyethylene glycol 17 g packet Commonly known as: MIRALAX / GLYCOLAX Take 17 g by mouth daily as needed for mild constipation.   triamcinolone 0.1 % Commonly known as: KENALOG Apply 1 application topically 2 (two) times daily as needed (psoriasis). TAC 0.1%-SSD1%  3:1            Discharge Care Instructions  (From admission, onward)         Start     Ordered   05/22/20 0000  Change dressing       Comments: Maintain surgical dressing until follow up in the clinic. If the edges start to pull up, may reinforce with tape. If the dressing is no longer working, may remove and cover with gauze and tape, but must keep the area dry and clean.  Call with any questions or concerns.   05/22/20 [id number]          Follow-up Information    Durene Romanslin, Matthew, MD. Schedule an appointment as soon as possible for a visit in 2 weeks.   Specialty: Orthopedic Surgery Contact information: [address] [place] [id number] [phone]               Signed: Dennie Bibleshley Catalina Salasar, PA-C Orthopedic Surgery 05/27/2020, 8:12 AM
